# Patient Record
Sex: Female | Born: 1980 | Race: White | Hispanic: Yes | Marital: Single | Smoking: Never smoker
Health system: Southern US, Community
[De-identification: ages and names within clinical notes are randomized; demographics above are authoritative.]

## PROBLEM LIST (undated history)

## (undated) DIAGNOSIS — K819 Cholecystitis, unspecified: Secondary | ICD-10-CM

## (undated) DIAGNOSIS — Z531 Procedure and treatment not carried out because of patient's decision for reasons of belief and group pressure: Secondary | ICD-10-CM

## (undated) DIAGNOSIS — N390 Urinary tract infection, site not specified: Secondary | ICD-10-CM

---

## 1998-09-18 ENCOUNTER — Emergency Department (HOSPITAL_COMMUNITY): Admission: EM | Admit: 1998-09-18 | Discharge: 1998-09-18 | Payer: Self-pay | Admitting: Emergency Medicine

## 2000-01-26 ENCOUNTER — Inpatient Hospital Stay (HOSPITAL_COMMUNITY): Admission: AD | Admit: 2000-01-26 | Discharge: 2000-01-26 | Payer: Self-pay | Admitting: Obstetrics & Gynecology

## 2000-03-16 ENCOUNTER — Ambulatory Visit (HOSPITAL_COMMUNITY): Admission: RE | Admit: 2000-03-16 | Discharge: 2000-03-16 | Payer: Self-pay | Admitting: *Deleted

## 2000-03-30 ENCOUNTER — Ambulatory Visit (HOSPITAL_COMMUNITY): Admission: RE | Admit: 2000-03-30 | Discharge: 2000-03-30 | Payer: Self-pay | Admitting: Obstetrics & Gynecology

## 2000-08-07 ENCOUNTER — Inpatient Hospital Stay (HOSPITAL_COMMUNITY): Admission: AD | Admit: 2000-08-07 | Discharge: 2000-08-08 | Payer: Self-pay | Admitting: Obstetrics

## 2002-08-07 ENCOUNTER — Encounter (HOSPITAL_COMMUNITY): Admission: RE | Admit: 2002-08-07 | Discharge: 2002-08-07 | Payer: Self-pay | Admitting: *Deleted

## 2002-08-09 ENCOUNTER — Inpatient Hospital Stay (HOSPITAL_COMMUNITY): Admission: AD | Admit: 2002-08-09 | Discharge: 2002-08-11 | Payer: Self-pay | Admitting: *Deleted

## 2004-03-13 ENCOUNTER — Inpatient Hospital Stay (HOSPITAL_COMMUNITY): Admission: AD | Admit: 2004-03-13 | Discharge: 2004-03-15 | Payer: Self-pay | Admitting: Obstetrics

## 2006-10-25 ENCOUNTER — Emergency Department (HOSPITAL_COMMUNITY): Admission: EM | Admit: 2006-10-25 | Discharge: 2006-10-25 | Payer: Self-pay | Admitting: Emergency Medicine

## 2015-08-04 DIAGNOSIS — K819 Cholecystitis, unspecified: Secondary | ICD-10-CM

## 2015-08-04 HISTORY — DX: Cholecystitis, unspecified: K81.9

## 2015-08-31 ENCOUNTER — Encounter (HOSPITAL_COMMUNITY): Payer: Self-pay | Admitting: *Deleted

## 2015-08-31 ENCOUNTER — Observation Stay (HOSPITAL_COMMUNITY)
Admission: EM | Admit: 2015-08-31 | Discharge: 2015-09-02 | Disposition: A | Discharge status: Home / Self Care | Payer: Medicaid Other | Attending: General Surgery | Admitting: General Surgery

## 2015-08-31 DIAGNOSIS — N390 Urinary tract infection, site not specified: Secondary | ICD-10-CM | POA: Diagnosis not present

## 2015-08-31 DIAGNOSIS — Z23 Encounter for immunization: Secondary | ICD-10-CM | POA: Diagnosis not present

## 2015-08-31 DIAGNOSIS — K8012 Calculus of gallbladder with acute and chronic cholecystitis without obstruction: Principal | ICD-10-CM | POA: Insufficient documentation

## 2015-08-31 DIAGNOSIS — K8000 Calculus of gallbladder with acute cholecystitis without obstruction: Secondary | ICD-10-CM | POA: Diagnosis present

## 2015-08-31 DIAGNOSIS — K810 Acute cholecystitis: Secondary | ICD-10-CM | POA: Diagnosis present

## 2015-08-31 HISTORY — DX: Urinary tract infection, site not specified: N39.0

## 2015-08-31 HISTORY — DX: Cholecystitis, unspecified: K81.9

## 2015-08-31 HISTORY — DX: Procedure and treatment not carried out because of patient's decision for reasons of belief and group pressure: Z53.1

## 2015-08-31 LAB — URINALYSIS, ROUTINE W REFLEX MICROSCOPIC
BILIRUBIN URINE: NEGATIVE
GLUCOSE, UA: NEGATIVE mg/dL
Ketones, ur: >80 mg/dL — AB
Nitrite: POSITIVE — AB
PH: 7.5 (ref 5.0–8.0)
Protein, ur: NEGATIVE mg/dL
SPECIFIC GRAVITY, URINE: 1.008 (ref 1.005–1.030)

## 2015-08-31 LAB — COMPREHENSIVE METABOLIC PANEL
ALBUMIN: 4.5 g/dL (ref 3.5–5.0)
ALK PHOS: 47 U/L (ref 38–126)
ALT: 16 U/L (ref 14–54)
ANION GAP: 11 (ref 5–15)
AST: 17 U/L (ref 15–41)
BUN: <5 mg/dL — ABNORMAL LOW (ref 6–20)
CALCIUM: 9.5 mg/dL (ref 8.9–10.3)
CO2: 21 mmol/L — AB (ref 22–32)
Chloride: 106 mmol/L (ref 101–111)
Creatinine, Ser: 0.49 mg/dL (ref 0.44–1.00)
GFR calc Af Amer: >60 mL/min (ref >60)
GFR calc non Af Amer: >60 mL/min (ref >60)
GLUCOSE: 117 mg/dL — AB (ref 65–99)
POTASSIUM: 3.8 mmol/L (ref 3.5–5.1)
SODIUM: 138 mmol/L (ref 135–145)
Total Bilirubin: 0.9 mg/dL (ref 0.3–1.2)
Total Protein: 7.6 g/dL (ref 6.5–8.1)

## 2015-08-31 LAB — URINE MICROSCOPIC-ADD ON

## 2015-08-31 LAB — CBC
HEMATOCRIT: 41 % (ref 36.0–46.0)
HEMOGLOBIN: 14.2 g/dL (ref 12.0–15.0)
MCH: 29.8 pg (ref 26.0–34.0)
MCHC: 34.6 g/dL (ref 30.0–36.0)
MCV: 86.1 fL (ref 78.0–100.0)
Platelets: 252 10*3/uL (ref 150–400)
RBC: 4.76 MIL/uL (ref 3.87–5.11)
RDW: 12.5 % (ref 11.5–15.5)
WBC: 9.9 10*3/uL (ref 4.0–10.5)

## 2015-08-31 LAB — I-STAT BETA HCG BLOOD, ED (MC, WL, AP ONLY): I-stat hCG, quantitative: <5 m[IU]/mL (ref <5)

## 2015-08-31 LAB — LIPASE, BLOOD: Lipase: 20 U/L (ref 11–51)

## 2015-08-31 MED ORDER — OXYCODONE-ACETAMINOPHEN 5-325 MG PO TABS
1.0000 | ORAL_TABLET | ORAL | Status: DC | PRN
  Administered 2015-08-31: 1 via ORAL

## 2015-08-31 MED ORDER — OXYCODONE-ACETAMINOPHEN 5-325 MG PO TABS
ORAL_TABLET | ORAL | Status: AC
  Filled 2015-08-31: qty 1

## 2015-08-31 NOTE — ED Notes (Signed)
Pt was sent here with 2 day history of right upper abdominal pain that radiates to back and US showed findings suggestive of acute cholecystitis and biliary distention.

## 2015-09-01 ENCOUNTER — Observation Stay (HOSPITAL_COMMUNITY): Payer: Medicaid Other | Admitting: Certified Registered Nurse Anesthetist

## 2015-09-01 ENCOUNTER — Encounter (HOSPITAL_COMMUNITY): Payer: Self-pay | Admitting: Certified Registered Nurse Anesthetist

## 2015-09-01 ENCOUNTER — Encounter (HOSPITAL_COMMUNITY)
Admission: EM | Disposition: A | Discharge status: Home / Self Care | Payer: Self-pay | Source: Home / Self Care | Attending: Emergency Medicine

## 2015-09-01 DIAGNOSIS — Z23 Encounter for immunization: Secondary | ICD-10-CM | POA: Diagnosis not present

## 2015-09-01 DIAGNOSIS — N390 Urinary tract infection, site not specified: Secondary | ICD-10-CM | POA: Diagnosis not present

## 2015-09-01 DIAGNOSIS — K8012 Calculus of gallbladder with acute and chronic cholecystitis without obstruction: Secondary | ICD-10-CM | POA: Diagnosis not present

## 2015-09-01 DIAGNOSIS — K8000 Calculus of gallbladder with acute cholecystitis without obstruction: Secondary | ICD-10-CM | POA: Diagnosis present

## 2015-09-01 HISTORY — PX: CHOLECYSTECTOMY: SHX55

## 2015-09-01 SURGERY — LAPAROSCOPIC CHOLECYSTECTOMY WITH INTRAOPERATIVE CHOLANGIOGRAM
Anesthesia: General | Site: Abdomen

## 2015-09-01 MED ORDER — DEXTROSE 5 % IV SOLN
2.0000 g | Freq: Once | INTRAVENOUS | Status: AC
  Administered 2015-09-01: 2 g via INTRAVENOUS
  Filled 2015-09-01: qty 2

## 2015-09-01 MED ORDER — DEXAMETHASONE SODIUM PHOSPHATE 10 MG/ML IJ SOLN
INTRAMUSCULAR | Status: AC
  Filled 2015-09-01: qty 1

## 2015-09-01 MED ORDER — ONDANSETRON 4 MG PO TBDP: 4.0000 mg | ORAL_TABLET | Freq: Four times a day (QID) | ORAL | Status: DC | PRN

## 2015-09-01 MED ORDER — LIDOCAINE HCL (CARDIAC) 20 MG/ML IV SOLN
INTRAVENOUS | Status: DC | PRN
  Administered 2015-09-01: 40 mg via INTRAVENOUS

## 2015-09-01 MED ORDER — 0.9 % SODIUM CHLORIDE (POUR BTL) OPTIME
TOPICAL | Status: DC | PRN
  Administered 2015-09-01: 1000 mL

## 2015-09-01 MED ORDER — DIPHENHYDRAMINE HCL 12.5 MG/5ML PO ELIX: 12.5000 mg | ORAL_SOLUTION | Freq: Four times a day (QID) | ORAL | Status: DC | PRN

## 2015-09-01 MED ORDER — ACETAMINOPHEN 650 MG RE SUPP: 650.0000 mg | Freq: Four times a day (QID) | RECTAL | Status: DC | PRN

## 2015-09-01 MED ORDER — ACETAMINOPHEN 160 MG/5ML PO SOLN
325.0000 mg | ORAL | Status: DC | PRN
  Filled 2015-09-01: qty 20.3

## 2015-09-01 MED ORDER — ONDANSETRON HCL 4 MG/2ML IJ SOLN
INTRAMUSCULAR | Status: DC | PRN
  Administered 2015-09-01: 4 mg via INTRAVENOUS

## 2015-09-01 MED ORDER — GLYCOPYRROLATE 0.2 MG/ML IJ SOLN
INTRAMUSCULAR | Status: DC | PRN
  Administered 2015-09-01: .2 mg via INTRAVENOUS

## 2015-09-01 MED ORDER — KCL IN DEXTROSE-NACL 20-5-0.45 MEQ/L-%-% IV SOLN
INTRAVENOUS | Status: DC
  Administered 2015-09-01 – 2015-09-02 (×3): via INTRAVENOUS
  Filled 2015-09-01 (×3): qty 1000

## 2015-09-01 MED ORDER — FENTANYL CITRATE (PF) 100 MCG/2ML IJ SOLN
INTRAMUSCULAR | Status: DC | PRN
  Administered 2015-09-01 (×3): 50 ug via INTRAVENOUS
  Administered 2015-09-01: 100 ug via INTRAVENOUS
  Administered 2015-09-01 (×5): 50 ug via INTRAVENOUS

## 2015-09-01 MED ORDER — FENTANYL CITRATE (PF) 250 MCG/5ML IJ SOLN
INTRAMUSCULAR | Status: AC
  Filled 2015-09-01: qty 5

## 2015-09-01 MED ORDER — SCOPOLAMINE 1 MG/3DAYS TD PT72
MEDICATED_PATCH | TRANSDERMAL | Status: AC
  Administered 2015-09-01: 1 via TRANSDERMAL
  Filled 2015-09-01: qty 1

## 2015-09-01 MED ORDER — IOPAMIDOL (ISOVUE-300) INJECTION 61%
INTRAVENOUS | Status: AC
  Filled 2015-09-01: qty 50

## 2015-09-01 MED ORDER — DIPHENHYDRAMINE HCL 50 MG/ML IJ SOLN: 12.5000 mg | Freq: Four times a day (QID) | INTRAMUSCULAR | Status: DC | PRN

## 2015-09-01 MED ORDER — DEXAMETHASONE SODIUM PHOSPHATE 10 MG/ML IJ SOLN
INTRAMUSCULAR | Status: DC | PRN
  Administered 2015-09-01: 10 mg via INTRAVENOUS

## 2015-09-01 MED ORDER — FENTANYL CITRATE (PF) 100 MCG/2ML IJ SOLN
INTRAMUSCULAR | Status: AC
  Filled 2015-09-01: qty 2

## 2015-09-01 MED ORDER — OXYCODONE HCL 5 MG/5ML PO SOLN: 5.0000 mg | Freq: Once | ORAL | Status: DC | PRN

## 2015-09-01 MED ORDER — ZOLPIDEM TARTRATE 5 MG PO TABS: 5.0000 mg | ORAL_TABLET | Freq: Every evening | ORAL | Status: DC | PRN

## 2015-09-01 MED ORDER — MIDAZOLAM HCL 5 MG/5ML IJ SOLN
INTRAMUSCULAR | Status: DC | PRN
  Administered 2015-09-01: 2 mg via INTRAVENOUS

## 2015-09-01 MED ORDER — OXYCODONE HCL 5 MG PO TABS: 5.0000 mg | ORAL_TABLET | Freq: Once | ORAL | Status: DC | PRN

## 2015-09-01 MED ORDER — SUCCINYLCHOLINE CHLORIDE 20 MG/ML IJ SOLN
INTRAMUSCULAR | Status: DC | PRN
  Administered 2015-09-01: 40 mg via INTRAVENOUS

## 2015-09-01 MED ORDER — ROCURONIUM BROMIDE 50 MG/5ML IV SOLN
INTRAVENOUS | Status: AC
  Filled 2015-09-01: qty 1

## 2015-09-01 MED ORDER — LACTATED RINGERS IV SOLN
INTRAVENOUS | Status: DC | PRN
  Administered 2015-09-01 (×2): via INTRAVENOUS

## 2015-09-01 MED ORDER — BUPIVACAINE-EPINEPHRINE 0.25% -1:200000 IJ SOLN
INTRAMUSCULAR | Status: DC | PRN
  Administered 2015-09-01: 20 mL

## 2015-09-01 MED ORDER — SODIUM CHLORIDE 0.9 % IV BOLUS (SEPSIS)
1000.0000 mL | Freq: Once | INTRAVENOUS | Status: AC
  Administered 2015-09-01: 1000 mL via INTRAVENOUS

## 2015-09-01 MED ORDER — ACETAMINOPHEN 325 MG PO TABS: 325.0000 mg | ORAL_TABLET | ORAL | Status: DC | PRN

## 2015-09-01 MED ORDER — SIMETHICONE 80 MG PO CHEW
40.0000 mg | CHEWABLE_TABLET | Freq: Four times a day (QID) | ORAL | Status: DC | PRN
  Administered 2015-09-01: 40 mg via ORAL
  Filled 2015-09-01 (×2): qty 1

## 2015-09-01 MED ORDER — MORPHINE SULFATE (PF) 2 MG/ML IV SOLN: 1.0000 mg | INTRAVENOUS | Status: DC | PRN

## 2015-09-01 MED ORDER — ONDANSETRON HCL 4 MG/2ML IJ SOLN
4.0000 mg | Freq: Once | INTRAMUSCULAR | Status: AC
  Administered 2015-09-01: 4 mg via INTRAVENOUS
  Filled 2015-09-01: qty 2

## 2015-09-01 MED ORDER — HEMOSTATIC AGENTS (NO CHARGE) OPTIME
TOPICAL | Status: DC | PRN
  Administered 2015-09-01: 1 via TOPICAL

## 2015-09-01 MED ORDER — INFLUENZA VAC SPLIT QUAD 0.5 ML IM SUSY
0.5000 mL | PREFILLED_SYRINGE | INTRAMUSCULAR | Status: AC
  Administered 2015-09-02: 0.5 mL via INTRAMUSCULAR
  Filled 2015-09-01: qty 0.5

## 2015-09-01 MED ORDER — MIDAZOLAM HCL 2 MG/2ML IJ SOLN
INTRAMUSCULAR | Status: AC
  Filled 2015-09-01: qty 2

## 2015-09-01 MED ORDER — DEXTROSE 5 % IV SOLN
2.0000 g | INTRAVENOUS | Status: DC
  Administered 2015-09-01: 2 g via INTRAVENOUS
  Filled 2015-09-01 (×2): qty 2

## 2015-09-01 MED ORDER — ACETAMINOPHEN 325 MG PO TABS
650.0000 mg | ORAL_TABLET | Freq: Four times a day (QID) | ORAL | Status: DC | PRN
  Administered 2015-09-01: 650 mg via ORAL
  Filled 2015-09-01: qty 2

## 2015-09-01 MED ORDER — METHOCARBAMOL 500 MG PO TABS: 500.0000 mg | ORAL_TABLET | Freq: Four times a day (QID) | ORAL | Status: DC | PRN

## 2015-09-01 MED ORDER — SODIUM CHLORIDE 0.9 % IR SOLN
Status: DC | PRN
  Administered 2015-09-01 (×2): 1000 mL

## 2015-09-01 MED ORDER — FENTANYL CITRATE (PF) 100 MCG/2ML IJ SOLN
25.0000 ug | INTRAMUSCULAR | Status: DC | PRN
  Administered 2015-09-01 (×2): 25 ug via INTRAVENOUS

## 2015-09-01 MED ORDER — KETOROLAC TROMETHAMINE 15 MG/ML IJ SOLN: 15.0000 mg | Freq: Four times a day (QID) | INTRAMUSCULAR | Status: DC | PRN

## 2015-09-01 MED ORDER — KETOROLAC TROMETHAMINE 15 MG/ML IJ SOLN
15.0000 mg | Freq: Four times a day (QID) | INTRAMUSCULAR | Status: DC
  Administered 2015-09-01 – 2015-09-02 (×2): 15 mg via INTRAVENOUS
  Filled 2015-09-01 (×2): qty 1

## 2015-09-01 MED ORDER — NEOSTIGMINE METHYLSULFATE 10 MG/10ML IV SOLN
INTRAVENOUS | Status: DC | PRN
  Administered 2015-09-01: 3 mg via INTRAVENOUS

## 2015-09-01 MED ORDER — MORPHINE SULFATE (PF) 4 MG/ML IV SOLN
6.0000 mg | Freq: Once | INTRAVENOUS | Status: AC
  Administered 2015-09-01: 6 mg via INTRAVENOUS
  Filled 2015-09-01: qty 2

## 2015-09-01 MED ORDER — BUPIVACAINE-EPINEPHRINE (PF) 0.25% -1:200000 IJ SOLN
INTRAMUSCULAR | Status: AC
  Filled 2015-09-01: qty 30

## 2015-09-01 MED ORDER — ONDANSETRON HCL 4 MG/2ML IJ SOLN: 4.0000 mg | Freq: Four times a day (QID) | INTRAMUSCULAR | Status: DC | PRN

## 2015-09-01 MED ORDER — OXYCODONE-ACETAMINOPHEN 5-325 MG PO TABS
1.0000 | ORAL_TABLET | ORAL | Status: AC | PRN
  Administered 2015-09-01: 1 via ORAL
  Filled 2015-09-01: qty 1

## 2015-09-01 MED ORDER — PROPOFOL 10 MG/ML IV BOLUS
INTRAVENOUS | Status: DC | PRN
  Administered 2015-09-01: 140 mg via INTRAVENOUS
  Administered 2015-09-01: 60 mg via INTRAVENOUS

## 2015-09-01 MED ORDER — ROCURONIUM BROMIDE 100 MG/10ML IV SOLN
INTRAVENOUS | Status: DC | PRN
  Administered 2015-09-01: 10 mg via INTRAVENOUS
  Administered 2015-09-01: 30 mg via INTRAVENOUS

## 2015-09-01 MED ORDER — DOCUSATE SODIUM 100 MG PO CAPS
100.0000 mg | ORAL_CAPSULE | Freq: Two times a day (BID) | ORAL | Status: DC
  Administered 2015-09-01 – 2015-09-02 (×3): 100 mg via ORAL
  Filled 2015-09-01 (×3): qty 1

## 2015-09-01 MED ORDER — LIDOCAINE HCL (CARDIAC) 20 MG/ML IV SOLN
INTRAVENOUS | Status: AC
  Filled 2015-09-01: qty 5

## 2015-09-01 SURGICAL SUPPLY — 42 items
ADH SKN CLS APL DERMABOND .7 (GAUZE/BANDAGES/DRESSINGS) ×1
APPLIER CLIP 5 13 M/L LIGAMAX5 (MISCELLANEOUS) ×2
APR CLP MED LRG 5 ANG JAW (MISCELLANEOUS) ×1
BAG SPEC RTRVL LRG 6X4 10 (ENDOMECHANICALS) ×2
CANISTER SUCTION 2500CC (MISCELLANEOUS) ×2 IMPLANT
CHLORAPREP W/TINT 26ML (MISCELLANEOUS) ×2 IMPLANT
CLIP APPLIE 5 13 M/L LIGAMAX5 (MISCELLANEOUS) ×1 IMPLANT
COVER SURGICAL LIGHT HANDLE (MISCELLANEOUS) ×2 IMPLANT
DERMABOND ADVANCED (GAUZE/BANDAGES/DRESSINGS) ×1
DERMABOND ADVANCED .7 DNX12 (GAUZE/BANDAGES/DRESSINGS) ×1 IMPLANT
DRAPE C-ARM 42X72 X-RAY (DRAPES) IMPLANT
DRSG TEGADERM 2-3/8X2-3/4 SM (GAUZE/BANDAGES/DRESSINGS) ×8 IMPLANT
ELECT REM PT RETURN 9FT ADLT (ELECTROSURGICAL) ×2
ELECTRODE REM PT RTRN 9FT ADLT (ELECTROSURGICAL) ×1 IMPLANT
GLOVE BIO SURGEON STRL SZ 6.5 (GLOVE) ×1 IMPLANT
GLOVE BIOGEL PI IND STRL 7.5 (GLOVE) IMPLANT
GLOVE BIOGEL PI IND STRL 8 (GLOVE) ×1 IMPLANT
GLOVE BIOGEL PI INDICATOR 7.5 (GLOVE) ×2
GLOVE BIOGEL PI INDICATOR 8 (GLOVE) ×1
GLOVE ECLIPSE 7.5 STRL STRAW (GLOVE) ×2 IMPLANT
GLOVE SURG SS PI 7.5 STRL IVOR (GLOVE) ×2 IMPLANT
GOWN STRL REUS W/ TWL LRG LVL3 (GOWN DISPOSABLE) ×3 IMPLANT
GOWN STRL REUS W/TWL LRG LVL3 (GOWN DISPOSABLE) ×6
HEMOSTAT SNOW SURGICEL 2X4 (HEMOSTASIS) ×1 IMPLANT
KIT BASIN OR (CUSTOM PROCEDURE TRAY) ×2 IMPLANT
KIT ROOM TURNOVER OR (KITS) ×2 IMPLANT
NS IRRIG 1000ML POUR BTL (IV SOLUTION) ×2 IMPLANT
PAD ARMBOARD 7.5X6 YLW CONV (MISCELLANEOUS) ×2 IMPLANT
POUCH SPECIMEN RETRIEVAL 10MM (ENDOMECHANICALS) ×2 IMPLANT
SCISSORS LAP 5X35 DISP (ENDOMECHANICALS) ×2 IMPLANT
SET IRRIG TUBING LAPAROSCOPIC (IRRIGATION / IRRIGATOR) ×2 IMPLANT
SLEEVE ENDOPATH XCEL 5M (ENDOMECHANICALS) ×4 IMPLANT
SPECIMEN JAR SMALL (MISCELLANEOUS) ×2 IMPLANT
STRIP CLOSURE SKIN 1/2X4 (GAUZE/BANDAGES/DRESSINGS) ×2 IMPLANT
SUT MNCRL AB 4-0 PS2 18 (SUTURE) ×2 IMPLANT
TOWEL OR 17X24 6PK STRL BLUE (TOWEL DISPOSABLE) ×2 IMPLANT
TOWEL OR 17X26 10 PK STRL BLUE (TOWEL DISPOSABLE) ×2 IMPLANT
TRAY LAPAROSCOPIC MC (CUSTOM PROCEDURE TRAY) ×2 IMPLANT
TROCAR BLADELESS 11MM (ENDOMECHANICALS) ×1 IMPLANT
TROCAR XCEL BLUNT TIP 100MML (ENDOMECHANICALS) ×2 IMPLANT
TROCAR XCEL NON-BLD 5MMX100MML (ENDOMECHANICALS) ×2 IMPLANT
TUBING INSUFFLATION (TUBING) ×2 IMPLANT

## 2015-09-01 NOTE — ED Provider Notes (Signed)
CSN: [id number]     Arrival date & time 08/31/15  1804 History  By signing my name below, I, Bethel Born, attest that this documentation has been prepared under the direction and in the presence of Tomasita Crumble, MD. Electronically Signed: Bethel Born, ED Scribe. 09/01/2015. 12:57 AM   Chief Complaint  Patient presents with  . Abdominal Pain    The history is provided by the patient. No language interpreter was used.   Stacey Wade is a 35 y.o. female with no significant PMHx who presents to the Emergency Department complaining of, new, sharp, 9/10 in severity, upper abdominal pain with onset 2 days ago. Drinking Alka Seltzer and a baking soda water solution have provided insufficient pain relief at home. Associated symptoms include back pain, nausea, and 1 episode of emesis tonight. Pt present with an [place] report, after being seen at Urgent Care yesterday, that is suggestive of cholecystitis and biliary distention. After the [place] she was referred to the ED for further evaluation and treatment. Pt denies diarrhea, dysuria, and hematuria. No history of abdominal surgery.    History reviewed. No pertinent past medical history. History reviewed. No pertinent past surgical history. No family history on file. Social History  Substance Use Topics  . Smoking status: Never Smoker   . Smokeless tobacco: None  . Alcohol Use: No   OB History    No data available     Review of Systems  10 Systems reviewed and all are negative for acute change except as noted in the HPI.  Allergies  Review of patient's allergies indicates no known allergies.  Home Medications   Prior to Admission medications   Not on File   BP 128/83 mmHg  Pulse 99  Temp(Src) 98.2 F (36.8 C) (Oral)  Resp 18  SpO2 98% Physical Exam  Constitutional: She is oriented to person, place, and time. She appears well-developed and well-nourished. No distress.  HENT:  Head: Normocephalic and atraumatic.  Nose:  Nose normal.  Mouth/Throat: Oropharynx is clear and moist. No oropharyngeal exudate.  Eyes: Conjunctivae and EOM are normal. Pupils are equal, round, and reactive to light. No scleral icterus.  Neck: Normal range of motion. Neck supple. No JVD present. No tracheal deviation present. No thyromegaly present.  Cardiovascular: Normal rate, regular rhythm and normal heart sounds.  Exam reveals no gallop and no friction rub.   No murmur heard. Pulmonary/Chest: Effort normal and breath sounds normal. No respiratory distress. She has no wheezes. She exhibits no tenderness.  Abdominal: Soft. Bowel sounds are normal. She exhibits no distension and no mass. There is tenderness in the right upper quadrant. There is positive Murphy's sign. There is no rebound and no guarding.  Musculoskeletal: Normal range of motion. She exhibits no edema or tenderness.  Lymphadenopathy:    She has no cervical adenopathy.  Neurological: She is alert and oriented to person, place, and time. No cranial nerve deficit. She exhibits normal muscle tone.  Skin: Skin is warm and dry. No rash noted. No erythema. No pallor.  Nursing note and vitals reviewed.   ED Course  Procedures (including critical care time) DIAGNOSTIC STUDIES: Oxygen Saturation is 98% on RA,  normal by my interpretation.    COORDINATION OF CARE: 12:39 AM Discussed treatment plan which includes lab work and surgery consult with pt at bedside and pt agreed to plan.  12:55 AM-Consult complete with Dr. Donell Beers (General Surgery). Patient case explained and discussed. Call ended at 12:57 AM   Labs Review  Labs Reviewed  COMPREHENSIVE METABOLIC PANEL - Abnormal; Notable for the following:    CO2 21 (*)    Glucose, Bld 117 (*)    BUN <5 (*)    All other components within normal limits  URINALYSIS, ROUTINE W REFLEX MICROSCOPIC (NOT AT [facility]) - Abnormal; Notable for the following:    APPearance CLOUDY (*)    Hgb urine dipstick SMALL (*)    Ketones, ur >80 (*)     Nitrite POSITIVE (*)    Leukocytes, UA TRACE (*)    All other components within normal limits  URINE MICROSCOPIC-ADD ON - Abnormal; Notable for the following:    Squamous Epithelial / LPF 0-5 (*)    Bacteria, UA MANY (*)    All other components within normal limits  LIPASE, BLOOD  CBC  I-STAT BETA HCG BLOOD, ED ([facility], [facility], [facility] ONLY)    Imaging Review No results found. I have personally reviewed and evaluated these lab results as part of my medical decision-making.   EKG Interpretation None      MDM   Final diagnoses:  None    Patient presents emergency department for right upper quadrant abdominal pain. History is concerning for acute cholecystitis. She has an ultrasound from [facility] which also shows gallbladder distention, thickened gallbladder wall, pericholecystic fluid, and positive sonographic Murphy sign. There is concern for acute cholecystitis. My physical exam also shows a positive Murphy's sign. Patient was given ceftriaxone for treatment. She was given morphine for pain control. She will be admitted for operative intervention.   I personally performed the services described in this documentation, which was scribed in my presence. The recorded information has been reviewed and is accurate.      Tomasita CrumbleAdeleke Ethyl Vila, MD 09/01/15 [phone]

## 2015-09-01 NOTE — Interval H&P Note (Signed)
History and Physical Interval Note:  Unfortunately the patient is still in the ED as there are no beds available in the hospital.  We will perform a lap chole with IOC in the OR later today, probably early afternoon.  Patient and her husband had no additional questios.  Marta LamasJames O. Gae BonWyatt, III, MD, FACS [phone]--pager [phone]--office [facility]  09/01/2015 7:43 AM  Stacey Wade  has presented today for surgery, with the diagnosis of cholelithiasis  The various methods of treatment have been discussed with the patient and family. After consideration of risks, benefits and other options for treatment, the patient has consented to  Procedure(s): LAPAROSCOPIC CHOLECYSTECTOMY WITH INTRAOPERATIVE CHOLANGIOGRAM (N/A) as a surgical intervention .  The patient's history has been reviewed, patient examined, no change in status, stable for surgery.  I have reviewed the patient's chart and labs.  Questions were answered to the patient's satisfaction.     Stacey Wade

## 2015-09-01 NOTE — Anesthesia Postprocedure Evaluation (Signed)
Anesthesia Post Note  Patient: Stacey JakobSoila M Wade  Procedure(s) Performed: Procedure(s) (LRB): LAPAROSCOPIC CHOLECYSTECTOMY (N/A)  Patient location during evaluation: PACU Anesthesia Type: General Level of consciousness: awake and alert Pain management: pain level controlled Vital Signs Assessment: post-procedure vital signs reviewed and stable Respiratory status: spontaneous breathing, nonlabored ventilation, respiratory function stable and patient connected to nasal cannula oxygen Cardiovascular status: blood pressure returned to baseline and stable Postop Assessment: no signs of nausea or vomiting Anesthetic complications: no    Last Vitals:  Filed Vitals:   09/01/15 1255 09/01/15 1310  BP: 107/65 107/68  Pulse: 88 68  Temp: 36.4 C   Resp: 13 12    Last Pain:  Filed Vitals:   09/01/15 1320  PainSc: 1                  Taeya Theall S

## 2015-09-01 NOTE — Discharge Instructions (Signed)
Laparoscopic Cholecystectomy, Care After °Refer to this sheet in the next few weeks. These instructions provide you with information about caring for yourself after your procedure. Your health care provider may also give you more specific instructions. Your treatment has been planned according to current medical practices, but problems sometimes occur. Call your health care provider if you have any problems or questions after your procedure. °WHAT TO EXPECT AFTER THE PROCEDURE °After your procedure, it is common to have: °· Pain at your incision sites. You will be given pain medicines to control your pain. °· Mild nausea or vomiting. This should improve after the first 24 hours. °· Bloating and possible shoulder pain from the gas that was used during the procedure. This will improve after the first 24 hours. °HOME CARE INSTRUCTIONS °Incision Care °· Follow instructions from your health care provider about how to take care of your incisions. Make sure you: °¨ Wash your hands with soap and water before you change your bandage (dressing). If soap and water are not available, use hand sanitizer. °¨ Change your dressing as told by your health care provider. °¨ Leave stitches (sutures), skin glue, or adhesive strips in place. These skin closures may need to be in place for 2 weeks or longer. If adhesive strip edges start to loosen and curl up, you may trim the loose edges. Do not remove adhesive strips completely unless your health care provider tells you to do that. °· Do not take baths, swim, or use a hot tub until your health care provider approves. Ask your health care provider if you can take showers. You may only be allowed to take sponge baths for bathing. °General Instructions °· Take over-the-counter and prescription medicines only as told by your health care provider. °· Do not drive or operate heavy machinery while taking prescription pain medicine. °· Return to your normal diet as told by your health care  provider. °· Do not lift anything that is heavier than 10 lb (4.5 kg). °· Do not play contact sports for one week or until your health care provider approves. °SEEK MEDICAL CARE IF:  °· You have redness, swelling, or pain at the site of your incision. °· You have fluid, blood, or pus coming from your incision. °· You notice a bad smell coming from your incision area. °· Your surgical incisions break open. °· You have a fever. °SEEK IMMEDIATE MEDICAL CARE IF: °· You develop a rash. °· You have difficulty breathing. °· You have chest pain. °· You have increasing pain in your shoulders (shoulder strap areas). °· You faint or have dizzy episodes while you are standing. °· You have severe pain in your abdomen. °· You have nausea or vomiting that lasts for more than one day. °  °This information is not intended to replace advice given to you by your health care provider. Make sure you discuss any questions you have with your health care provider. °  °Document Released: 05/22/2005 Document Revised: 02/10/2015 Document Reviewed: 01/01/2013 °Elsevier Interactive Patient Education ©2016 [employer] °CCS ______[facility] °LAPAROSCOPIC SURGERY: POST OP INSTRUCTIONS °Always review your discharge instruction sheet given to you by the facility where your surgery was performed. °IF YOU HAVE DISABILITY OR FAMILY LEAVE FORMS, YOU MUST BRING THEM TO THE OFFICE FOR PROCESSING.   °DO NOT GIVE THEM TO YOUR DOCTOR. ° °1. A prescription for pain medication may be given to you upon discharge.  Take your pain medication as prescribed, if needed.  If narcotic   pain medicine is not needed, then you may take acetaminophen (Tylenol) or ibuprofen (Advil) as needed. °2. Take your usually prescribed medications unless otherwise directed. °3. If you need a refill on your pain medication, please contact your pharmacy.  They will contact our office to request authorization. Prescriptions will not be filled after 5pm or on  week-ends. °4. You should follow a light diet the first few days after arrival home, such as soup and crackers, etc.  Be sure to include lots of fluids daily. °5. Most patients will experience some swelling and bruising in the area of the incisions.  Ice packs will help.  Swelling and bruising can take several days to resolve.  °6. It is common to experience some constipation if taking pain medication after surgery.  Increasing fluid intake and taking a stool softener (such as Colace) will usually help or prevent this problem from occurring.  A mild laxative (Milk of Magnesia or Miralax) should be taken according to package instructions if there are no bowel movements after 48 hours. °7. Unless discharge instructions indicate otherwise, you may remove your bandages 24-48 hours after surgery, and you may shower at that time.  You may have steri-strips (small skin tapes) in place directly over the incision.  These strips should be left on the skin for 7-10 days.  If your surgeon used skin glue on the incision, you may shower in 24 hours.  The glue will flake off over the next 2-3 weeks.  Any sutures or staples will be removed at the office during your follow-up visit. °8. ACTIVITIES:  You may resume regular (light) daily activities beginning the next day--such as daily self-care, walking, climbing stairs--gradually increasing activities as tolerated.  You may have sexual intercourse when it is comfortable.  Refrain from any heavy lifting or straining until approved by your doctor. °a. You may drive when you are no longer taking prescription pain medication, you can comfortably wear a seatbelt, and you can safely maneuver your car and apply brakes. °b. RETURN TO WORK:  __________________________________________________________ °9. You should see your doctor in the office for a follow-up appointment approximately 2-3 weeks after your surgery.  Make sure that you call for this appointment within a day or two after you  arrive home to insure a convenient appointment time. °10. OTHER INSTRUCTIONS: __________________________________________________________________________________________________________________________ __________________________________________________________________________________________________________________________ °WHEN TO CALL YOUR DOCTOR: °1. Fever over 101.0 °2. Inability to urinate °3. Continued bleeding from incision. °4. Increased pain, redness, or drainage from the incision. °5. Increasing abdominal pain ° °The clinic staff is available to answer your questions during regular business hours.  Please don’t hesitate to call and ask to speak to one of the nurses for clinical concerns.  If you have a medical emergency, go to the nearest emergency room or call 911.  A surgeon from [facility] is always on call at the hospital. °[address], [address], [place], [place]  [address] ? [address] [address], [place], [place]   [address] °[phone] ? [phone] ? FAX [phone] °Web site: [email or web address] ° °

## 2015-09-01 NOTE — Op Note (Signed)
OPERATIVE REPORT  DATE OF OPERATION:  09/01/2015  PATIENT:  Stacey Wade  35 y.o. female  PRE-OPERATIVE DIAGNOSIS:  Cholelithiasis and acute cholecystitis  POST-OPERATIVE DIAGNOSIS:  Same  PROCEDURE:  Procedure(s): LAPAROSCOPIC CHOLECYSTECTOMY  SURGEON:  Surgeon(s): Jimmye NormanJames Oneal Biglow, MD  ASSISTANT: None  ANESTHESIA:   general  EBL: 50 ml  BLOOD ADMINISTERED: none  DRAINS: none   SPECIMEN:  Source of Specimen:  Gallbladder and contents  COUNTS CORRECT:  YES  PROCEDURE DETAILS: The patient was taken to the operating room and placed on the table in the supine position.  After an adequate endotracheal anesthetic was administered, the patient was prepped with ChloroPrep, and then draped in the usual manner exposing the entire abdomen laterally, inferiorly and up  to the costal margins.  After a proper timeout was performed including identifying the patient and the procedure to be performed, a supraumbilical 1.5cm midline incision was made using a #15 blade.  This was taken down to the fascia which was then incised with a #15 blade.  The edges of the fascia were tented up with Kocher clamps as the preperitoneal space was penetrated with a Kelly clamp into the peritoneum.  Once this was done, a pursestring suture of 0 Vicryl was passed around the fascial opening.  This was subsequently used to secure the [facility] which was passed into the peritoneal cavity.  Once the [facility] cannula was in place, carbon dioxide gas was insufflated into the peritoneal cavity up to a maximal intra-abdominal pressure of 15mm Hg.The laparoscope, with attached camera and light source, was passed into the peritoneal cavity to visualize the direct insertion of two right upper quadrant 5mm cannulas, and a sup-xiphoid 5mm cannula. This was increased to a 11mm cannula during the case to accommodate a stone scooper which was used to retrieved stones that had fallen out of the torn gallbladder.   Once all  cannulas were in place, the dissection was begun.  Two ratcheted graspers were attached to the dome and infundibulum of the gallbladder and retracted towards the anterior abdominal wall and the right upper quadrant.  Using cautery attached to a dissecting forceps, the peritoneum overlaying the triangle of Chalot and the hepatoduodenal triangle was dissected away exposing the cystic duct and the cystic artery.  A critical window was developed between the CBD and the cystic duct The cystic artery was clipped proximally and distally then transected.  A clip was placed on the gallbladder side of the cystic duct, then the distal cystic duct was clipped multiple times then transected between the clips.  The gallbladder was then dissected out of the hepatic bed which was intensely inflamed and fused with the back wall of the gallbladder.  The gall bladder fossa was raw and bled somewhat (i.e. 20 cc or so) The bleeding was controlled with electrocautery.  Once the gallbladder was out, it was retrieved from the abdomen (using an EndoCatch bag) without event.  Multiple fragmented stone were also retrieved using a scoop device.  Once the gallbladder was removed, the bed was inspected for hemostasis.  Once excellent hemostasis was obtained all gas and fluids were aspirated from above the liver, then the cannulas were removed.  The supraumbilical incision was closed using the pursestring suture which was in place.  0.25% bupivicaine with epinephrine was injected at all sites.  All 10mm or greater cannula sites were close using a running subcuticular stitch of 4-0 Monocryl.  5.1050mm cannula sites were closed with Dermabond only.Steri-Strips and Tagaderm were  used to complete the dressings at all sites.  At this point all needle, sponge, and instrument counts were correct.The patient was awakened from anesthesia and taken to the PACU in stable condition.   PATIENT DISPOSITION:  PACU - hemodynamically stable.   Kashvi Prevette 3/29/201712:49 PM

## 2015-09-01 NOTE — Transfer of Care (Signed)
Immediate Anesthesia Transfer of Care Note  Patient: Stacey Wade  Procedure(s) Performed: Procedure(s): LAPAROSCOPIC CHOLECYSTECTOMY (N/A)  Patient Location: PACU  Anesthesia Type:General  Level of Consciousness: awake, alert , oriented and patient cooperative  Airway & Oxygen Therapy: Patient Spontanous Breathing and Patient connected to nasal cannula oxygen  Post-op Assessment: Report given to RN and Post -op Vital signs reviewed and stable  Post vital signs: Reviewed and stable  Last Vitals:  Filed Vitals:   09/01/15 0845 09/01/15 0953  BP: 99/69 112/69  Pulse: 60 70  Temp:    Resp:  16    Complications: No apparent anesthesia complications

## 2015-09-01 NOTE — H&P (Signed)
Stacey Wade is an 35 y.o. female.   Chief Complaint: abdominal pain HPI:  Pt is a 35 yo F who presents with 2 days epigastric abdominal pain, nausea, and 1x emesis.  She went to the urgent care yesterday and was diagnosed with gallstones and wall thickening.  She was referred to ED.  She tried antacids with out relief.  She describes the pain as radiating to her back.  She has had no recent pregnancy and does not know of any family history of gallbladder dx.  History reviewed. No pertinent past medical history.  History reviewed. No pertinent past surgical history.  No family history on file. Social History:  reports that she has never smoked. She does not have any smokeless tobacco history on file. She reports that she does not drink alcohol or use illicit drugs.  Allergies: No Known Allergies  Medications: None  Results for orders placed or performed during the hospital encounter of 08/31/15 (from the past 48 hour(s))  Lipase, blood     Status: None   Collection Time: 08/31/15  6:26 PM  Result Value Ref Range   Lipase 20 11 - 51 U/L  Comprehensive metabolic panel     Status: Abnormal   Collection Time: 08/31/15  6:26 PM  Result Value Ref Range   Sodium 138 135 - 145 mmol/L   Potassium 3.8 3.5 - 5.1 mmol/L   Chloride 106 101 - 111 mmol/L   CO2 21 (L) 22 - 32 mmol/L   Glucose, Bld 117 (H) 65 - 99 mg/dL   BUN <5 (L) 6 - 20 mg/dL   Creatinine, Ser 0.49 0.44 - 1.00 mg/dL   Calcium 9.5 8.9 - 10.3 mg/dL   Total Protein 7.6 6.5 - 8.1 g/dL   Albumin 4.5 3.5 - 5.0 g/dL   AST 17 15 - 41 U/L   ALT 16 14 - 54 U/L   Alkaline Phosphatase 47 38 - 126 U/L   Total Bilirubin 0.9 0.3 - 1.2 mg/dL   GFR calc non Af Amer >60 >60 mL/min   GFR calc Af Amer >60 >60 mL/min    Comment: (NOTE) The eGFR has been calculated using the CKD EPI equation. This calculation has not been validated in all clinical situations. eGFR's persistently <60 mL/min signify possible Chronic Kidney Disease.     Anion gap 11 5 - 15  CBC     Status: None   Collection Time: 08/31/15  6:26 PM  Result Value Ref Range   WBC 9.9 4.0 - 10.5 K/uL   RBC 4.76 3.87 - 5.11 MIL/uL   Hemoglobin 14.2 12.0 - 15.0 g/dL   HCT 41.0 36.0 - 46.0 %   MCV 86.1 78.0 - 100.0 fL   MCH 29.8 26.0 - 34.0 pg   MCHC 34.6 30.0 - 36.0 g/dL   RDW 12.5 11.5 - 15.5 %   Platelets 252 150 - 400 K/uL  Urinalysis, Routine w reflex microscopic (not at [facility])     Status: Abnormal   Collection Time: 08/31/15  6:48 PM  Result Value Ref Range   Color, Urine YELLOW YELLOW   APPearance CLOUDY (A) CLEAR   Specific Gravity, Urine 1.008 1.005 - 1.030   pH 7.5 5.0 - 8.0   Glucose, UA NEGATIVE NEGATIVE mg/dL   Hgb urine dipstick SMALL (A) NEGATIVE   Bilirubin Urine NEGATIVE NEGATIVE   Ketones, ur >80 (A) NEGATIVE mg/dL   Protein, ur NEGATIVE NEGATIVE mg/dL   Nitrite POSITIVE (A) NEGATIVE   Leukocytes,  UA TRACE (A) NEGATIVE  I-Stat beta hCG blood, ED ([facility], [facility], [facility] only)     Status: None   Collection Time: 08/31/15  6:48 PM  Result Value Ref Range   I-stat hCG, quantitative <5.0 <5 mIU/mL   Comment 3            Comment:   GEST. AGE      CONC.  (mIU/mL)   <=1 WEEK        5 - 50     2 WEEKS       50 - 500     3 WEEKS       100 - 10,000     4 WEEKS     1,000 - 30,000        FEMALE AND NON-PREGNANT FEMALE:     LESS THAN 5 mIU/mL   Urine microscopic-add on     Status: Abnormal   Collection Time: 08/31/15  6:48 PM  Result Value Ref Range   Squamous Epithelial / LPF 0-5 (A) NONE SEEN   WBC, UA 6-30 0 - 5 WBC/hpf   RBC / HPF 0-5 0 - 5 RBC/hpf   Bacteria, UA MANY (A) NONE SEEN   No results found.  Review of Systems  Constitutional: Negative.   HENT: Negative.   Eyes: Negative.   Respiratory: Negative.   Cardiovascular: Negative.   Gastrointestinal: Positive for nausea, vomiting and abdominal pain.  Musculoskeletal: Positive for back pain.  Skin: Negative.   Neurological: Negative.   Endo/Heme/Allergies: Negative.    Psychiatric/Behavioral: Negative.   All other systems reviewed and are negative.   Blood pressure 107/75, pulse 86, temperature 98.2 F (36.8 C), temperature source Oral, resp. rate 18, SpO2 99 %. Physical Exam  Constitutional: She is oriented to person, place, and time. She appears well-developed and well-nourished. No distress.  HENT:  Head: Normocephalic and atraumatic.  Nose: Nose normal.  Mouth/Throat: Oropharynx is clear and moist.  Eyes: Conjunctivae are normal. Pupils are equal, round, and reactive to light. Right eye exhibits no discharge. Left eye exhibits no discharge. No scleral icterus.  Neck: Normal range of motion. No thyromegaly present.  Cardiovascular: Normal rate, regular rhythm and intact distal pulses.   Respiratory: Effort normal. No respiratory distress.  GI: Soft. She exhibits no distension and no mass. There is tenderness (epigastric mild tenderness). There is no rebound and no guarding.  Musculoskeletal: Normal range of motion.  Neurological: She is alert and oriented to person, place, and time.  Skin: Skin is warm and dry. No rash noted. She is not diaphoretic. No erythema. No pallor.  Psychiatric: She has a normal mood and affect. Her behavior is normal. Judgment and thought content normal.     Assessment/Plan Acute calculous cholecystitis Community acquired UTI  NPO IVF IV antibiotics For lap chole with cholangiogram with Dr. Hulen Skains. Reviewed risks and benefits with patient and spouse.  **of note, pt is Jehovah's witness and desires NO blood products.Stark Klein, MD 09/01/2015, 1:55 AM

## 2015-09-01 NOTE — ED Notes (Signed)
Pt requests to NOT receive any blood products.

## 2015-09-01 NOTE — Anesthesia Preprocedure Evaluation (Signed)
Anesthesia Evaluation  Patient identified by MRN, date of birth, ID band Patient awake    Reviewed: Allergy & Precautions, NPO status , Patient's Chart, lab work & pertinent test results  History of Anesthesia Complications (+) history of anesthetic complications  Airway Mallampati: I  TM Distance: >3 FB Neck ROM: Full    Dental  (+) Teeth Intact   Pulmonary neg pulmonary ROS,    breath sounds clear to auscultation       Cardiovascular  Rhythm:Regular     Neuro/Psych negative neurological ROS  negative psych ROS   GI/Hepatic negative GI ROS, Neg liver ROS,   Endo/Other  negative endocrine ROS  Renal/GU negative Renal ROS     Musculoskeletal   Abdominal   Peds  Hematology  (+) JEHOVAH'S WITNESS  Anesthesia Other Findings   Reproductive/Obstetrics                             Anesthesia Physical Anesthesia Plan  ASA: I  Anesthesia Plan: General   Post-op Pain Management:    Induction: Intravenous, Rapid sequence and Cricoid pressure planned  Airway Management Planned: Oral ETT  Additional Equipment: None  Intra-op Plan:   Post-operative Plan: Extubation in OR  Informed Consent: I have reviewed the patients History and Physical, chart, labs and discussed the procedure including the risks, benefits and alternatives for the proposed anesthesia with the patient or authorized representative who has indicated his/her understanding and acceptance.   Dental advisory given  Plan Discussed with: CRNA and Surgeon  Anesthesia Plan Comments: (Dicussed blood products and patient's wishes preop. Refuses blood and blood related products at all cost including death. Will accept cell saver)        Anesthesia Quick Evaluation

## 2015-09-01 NOTE — Anesthesia Procedure Notes (Signed)
Procedure Name: Intubation Date/Time: 09/01/2015 11:12 AM Performed by: Stacey Wade, Stacey Wade [name] Pre-anesthesia Checklist: Patient identified, Emergency Drugs available, Suction available and Patient being monitored Patient Re-evaluated:Patient Re-evaluated prior to inductionOxygen Delivery Method: Circle System Utilized Preoxygenation: Pre-oxygenation with 100% oxygen Intubation Type: IV induction and Rapid sequence Laryngoscope Size: Miller and 2 Grade View: Grade I Tube type: Oral Tube size: 7.0 mm Number of attempts: 1 Airway Equipment and Method: Stylet Placement Confirmation: ETT inserted through vocal cords under direct vision,  positive ETCO2 and breath sounds checked- equal and bilateral Secured at: 21 cm Tube secured with: Tape Dental Injury: Teeth and Oropharynx as per pre-operative assessment

## 2015-09-02 ENCOUNTER — Encounter (HOSPITAL_COMMUNITY): Payer: Self-pay | Admitting: General Surgery

## 2015-09-02 DIAGNOSIS — Z23 Encounter for immunization: Secondary | ICD-10-CM | POA: Diagnosis not present

## 2015-09-02 DIAGNOSIS — K810 Acute cholecystitis: Secondary | ICD-10-CM | POA: Diagnosis present

## 2015-09-02 DIAGNOSIS — K8012 Calculus of gallbladder with acute and chronic cholecystitis without obstruction: Secondary | ICD-10-CM | POA: Diagnosis not present

## 2015-09-02 DIAGNOSIS — N390 Urinary tract infection, site not specified: Secondary | ICD-10-CM | POA: Diagnosis not present

## 2015-09-02 HISTORY — DX: Urinary tract infection, site not specified: N39.0

## 2015-09-02 MED ORDER — ACETAMINOPHEN 325 MG PO TABS: 650.0000 mg | ORAL_TABLET | Freq: Four times a day (QID) | ORAL | Status: AC | PRN

## 2015-09-02 MED ORDER — NAPROXEN SODIUM 220 MG PO TABS: 220.0000 mg | ORAL_TABLET | Freq: Two times a day (BID) | ORAL | Status: AC

## 2015-09-02 MED ORDER — OXYCODONE-ACETAMINOPHEN 5-325 MG PO TABS: 1.0000 | ORAL_TABLET | ORAL | Status: DC | PRN

## 2015-09-02 NOTE — Progress Notes (Signed)
1 Day Post-Op  Subjective: She is doing well ate breakfast and feels fine.  Ready to go home.  She has a Prescription from her PCP for the uti, but never got to start it.   Objective: Vital signs in last 24 hours: Temp:  [97.5 F (36.4 C)-98.7 F (37.1 C)] 98.7 F (37.1 C) (03/30 0843) Pulse Rate:  [59-88] 78 (03/30 0843) Resp:  [12-24] 18 (03/30 0843) BP: (97-120)/(51-71) 104/58 mmHg (03/30 0843) SpO2:  [99 %-100 %] 100 % (03/30 0843) Last BM Date: 08/31/15 680 PO recorded 900 urine Afebrile, VSS Diet:  Regular   Intake/Output from previous day: 03/29 0701 - 03/30 0700 In: 3951.7 [P.O.:680; I.V.:3271.7] Out: 920 [Urine:900; Blood:20] Intake/Output this shift: Total I/O In: 120 [P.O.:120] Out: -   General appearance: alert, cooperative and no distress GI: soft, sore, sites all look fine.  Lab Results:   Recent Labs  08/31/15 1826  WBC 9.9  HGB 14.2  HCT 41.0  PLT 252    BMET  Recent Labs  08/31/15 1826  NA 138  K 3.8  CL 106  CO2 21*  GLUCOSE 117*  BUN <5*  CREATININE 0.49  CALCIUM 9.5   PT/INR No results for input(s): LABPROT, INR in the last 72 hours.   Recent Labs Lab 08/31/15 1826  AST 17  ALT 16  ALKPHOS 47  BILITOT 0.9  PROT 7.6  ALBUMIN 4.5     Lipase     Component Value Date/Time   LIPASE 20 08/31/2015 1826     Studies/Results: No results found.  Medications: . cefTRIAXone (ROCEPHIN)  IV  2 g Intravenous Q24H  . docusate sodium  100 mg Oral BID  . Influenza vac split quadrivalent PF  0.5 mL Intramuscular Tomorrow-1000  . ketorolac  15 mg Intravenous 4 times per day    Assessment/Plan Acute cholecystitis S/p laparoscopic cholecystectomy 09/01/15, Dr. Woodward KuWyatt Community acquired UTI   Antibiotics: Ceftriaxone day 2 completed DVT:  SCD   Plan:  Home today, she will take her PCP'S antibiotics for her UTI and follow up with them for the UTI      Hebe Merriwether 09/02/2015 [phone]

## 2015-09-02 NOTE — Progress Notes (Signed)
Patient discharged to home with instructions, verbalized understanding. 

## 2015-09-02 NOTE — Discharge Summary (Signed)
Physician Discharge Summary  Patient ID: Stacey Wade MRN: [id number] DOB/AGE: 37/07/1980 37 y.o.  Admit date: 08/31/2015 Discharge date: 09/02/2015  Admission Diagnoses:  Acute calculous cholecystitis Community acquired UTI  Discharge Diagnoses:  SAME  Principal Problem:   Acute calculous cholecystitis Active Problems:   UTI (urinary tract infection)   PROCEDURES: LAPAROSCOPIC CHOLECYSTECTOMY, 09/01/15, DR. Vergia AlbertsJames Wyatt  Hospital Course:   Pt is a 35 yo F who presents with 2 days epigastric abdominal pain, nausea, and 1x emesis. She went to the urgent care yesterday and was diagnosed with gallstones and wall thickening. She was referred to ED. She tried antacids with out relief. She describes the pain as radiating to her back. She has had no recent pregnancy and does not know of any family history of gallbladder dx. She was admitted on 09/01/15 early AM by Dr. Donell BeersByerly, and taken to the OR later that day by Dr. Lindie SpruceWyatt.  She tolerated the procedure well.  Her diet was advanced the following AM and she was ready for discharge the later that AM.  She was treated for a UTI by PCP day prior but had not started the antibiotic.  She got Rocephin here, and we instructed her to take the prescribed antibiotics and follow up with her PCP for her UTI.  She will follow up in our clinic as noted below.  Condition on D/C:  Improved        Disposition: 01-Home or Self Care     Medication List    TAKE these medications        acetaminophen 325 MG tablet  Commonly known as:  TYLENOL  Take 2 tablets (650 mg total) by mouth every 6 (six) hours as needed for mild pain (or temp > 100).     naproxen sodium 220 MG tablet  Commonly known as:  ALEVE  Take 1 tablet (220 mg total) by mouth 2 (two) times daily with a meal.     oxyCODONE-acetaminophen 5-325 MG tablet  Commonly known as:  ROXICET  Take 1-2 tablets by mouth every 4 (four) hours as needed for severe pain.       Follow-up  Information    Follow up with [facility] On 09/21/2015.   Specialty:  General Surgery   Why:  Your appointment is at 2 PM, be at the office 30 minutes early for check in.   Contact information:   [address] [place] [address] [phone]       Follow up with [facility] On 09/21/2015.   Specialty:  General Surgery   Why:  Your appointment is at 2 PM, be at the office 30 minutes early for check in.     Contact information:   [address] [place] [address] [phone]       Follow up with Primary care. Schedule an appointment as soon as possible for a visit in 1 week.   Why:  Call for follow up for UTI after you have completed the course of antibiotics they prescribed prior to admission.      SignedSherrie George: Christin Moline 09/02/2015, 2:04 PM

## 2015-11-04 ENCOUNTER — Other Ambulatory Visit: Payer: Self-pay | Admitting: General Surgery

## 2015-11-04 DIAGNOSIS — R109 Unspecified abdominal pain: Secondary | ICD-10-CM

## 2015-11-09 ENCOUNTER — Ambulatory Visit
Admission: RE | Admit: 2015-11-09 | Discharge: 2015-11-09 | Disposition: A | Discharge status: Home / Self Care | Payer: Medicaid Other | Source: Ambulatory Visit | Attending: General Surgery | Admitting: General Surgery

## 2015-11-09 DIAGNOSIS — R109 Unspecified abdominal pain: Secondary | ICD-10-CM

## 2015-11-09 MED ORDER — IOPAMIDOL (ISOVUE-300) INJECTION 61%
100.0000 mL | Freq: Once | INTRAVENOUS | Status: AC | PRN
  Administered 2015-11-09: 100 mL via INTRAVENOUS

## 2017-07-11 ENCOUNTER — Other Ambulatory Visit: Payer: Self-pay | Admitting: Obstetrics & Gynecology

## 2017-07-11 DIAGNOSIS — Z1231 Encounter for screening mammogram for malignant neoplasm of breast: Secondary | ICD-10-CM

## 2017-07-31 ENCOUNTER — Ambulatory Visit
Admission: RE | Admit: 2017-07-31 | Discharge: 2017-07-31 | Disposition: A | Discharge status: Home / Self Care | Payer: Self-pay | Source: Ambulatory Visit | Attending: Obstetrics & Gynecology | Admitting: Obstetrics & Gynecology

## 2017-07-31 DIAGNOSIS — Z1231 Encounter for screening mammogram for malignant neoplasm of breast: Secondary | ICD-10-CM

## 2019-09-22 ENCOUNTER — Other Ambulatory Visit: Payer: Self-pay

## 2019-09-22 DIAGNOSIS — N644 Mastodynia: Secondary | ICD-10-CM

## 2019-09-22 DIAGNOSIS — N6320 Unspecified lump in the left breast, unspecified quadrant: Secondary | ICD-10-CM

## 2019-10-09 ENCOUNTER — Other Ambulatory Visit: Payer: No Typology Code available for payment source

## 2019-10-09 ENCOUNTER — Encounter: Payer: Self-pay | Admitting: Medical

## 2019-10-09 ENCOUNTER — Other Ambulatory Visit: Payer: Self-pay

## 2019-10-09 ENCOUNTER — Ambulatory Visit
Admission: RE | Admit: 2019-10-09 | Discharge: 2019-10-09 | Disposition: A | Discharge status: Home / Self Care | Payer: No Typology Code available for payment source | Source: Ambulatory Visit | Attending: Obstetrics and Gynecology | Admitting: Obstetrics and Gynecology

## 2019-10-09 ENCOUNTER — Ambulatory Visit: Payer: Self-pay | Admitting: Medical

## 2019-10-09 ENCOUNTER — Other Ambulatory Visit: Payer: Self-pay | Admitting: Obstetrics and Gynecology

## 2019-10-09 VITALS — BP 112/72 | Temp 97.7°F | Wt 99.5 lb

## 2019-10-09 DIAGNOSIS — N644 Mastodynia: Secondary | ICD-10-CM

## 2019-10-09 DIAGNOSIS — N6320 Unspecified lump in the left breast, unspecified quadrant: Secondary | ICD-10-CM

## 2019-10-09 DIAGNOSIS — N6310 Unspecified lump in the right breast, unspecified quadrant: Secondary | ICD-10-CM

## 2019-10-09 DIAGNOSIS — R921 Mammographic calcification found on diagnostic imaging of breast: Secondary | ICD-10-CM

## 2019-10-09 NOTE — Patient Instructions (Signed)
Mammogram °A mammogram is an X-ray of the breasts that is done to check for changes that are not normal. This test can screen for and find any changes that may suggest breast cancer. Mammograms are regularly done on women. A man may have a mammogram if he has a lump or swelling in his breast. This test can also help to find other changes and variations in the breast. °Tell a doctor: °· About any allergies you have. °· If you have breast implants. °· If you have had previous breast disease, biopsy, or surgery. °· If you are breastfeeding. °· If you are younger than age 25. °· If you have a family history of breast cancer. °· Whether you are pregnant or may be pregnant. °What are the risks? °Generally, this is a safe procedure. However, problems may occur, including: °· Exposure to radiation. Radiation levels are very low with this test. °· The results being misinterpreted. °· The need for further tests. °· The inability of the mammogram to detect certain cancers. °What happens before the procedure? °· Have this test done about 1-2 weeks after your period. This is usually when your breasts are the least tender. °· If you are visiting a new doctor or clinic, send any past mammogram images to your new doctor's office. °· Wash your breasts and under your arms the day of the test. °· Do not use deodorants, perfumes, lotions, or powders on the day of the test. °· Take off any jewelry from your neck. °· Wear clothes that you can change into and out of easily. °What happens during the procedure? ° °· You will undress from the waist up. You will put on a gown. °· You will stand in front of the X-ray machine. °· Each breast will be placed between two plastic or glass plates. The plates will press down on your breast for a few seconds. Try to stay as relaxed as possible. This does not cause any harm to your breasts. Any discomfort you feel will be very brief. °· X-rays will be taken from different angles of each breast. °The  procedure may vary among doctors and hospitals. °What happens after the procedure? °· The mammogram will be read by a specialist (radiologist). °· You may need to do certain parts of the test again. This depends on the quality of the images. °· Ask when your test results will be ready. Make sure you get your test results. °· You may go back to your normal activities. °Summary °· A mammogram is a low energy X-ray of the breasts that is done to check for abnormal changes. A man may have this test if he has a lump or swelling in his breast. °· Before the procedure, tell your doctor about any breast problems that you have had in the past. °· Have this test done about 1-2 weeks after your period. °· For the test, each breast will be placed between two plastic or glass plates. The plates will press down on your breast for a few seconds. °· The mammogram will be read by a specialist (radiologist). Ask when your test results will be ready. Make sure you get your test results. °This information is not intended to replace advice given to you by your health care provider. Make sure you discuss any questions you have with your health care provider. °Document Revised: 01/10/2018 Document Reviewed: 01/10/2018 °Elsevier Patient Education © 2020 [employer] ° °

## 2019-10-09 NOTE — Progress Notes (Signed)
[name] is a 39 y.o. female who presents to [facility] clinic today with complaint of left breat pain and lump x 2 months. Pain is intermittent and rated at 6/10 at the worst. She has not taken anything for pain. She denies nipple discharge.    Pap Smear: Pap not smear completed today. Last Pap smear was 2019 at [facility] was normal. Per patient has no history of an abnormal Pap smear. Last Pap smear result is not available in Epic.   Physical exam: Breasts Breasts symmetrical. No skin abnormalities bilateral breasts. No nipple retraction bilateral breasts. No nipple discharge bilateral breasts. No lymphadenopathy. Small masses noted in left breast at 3:00 and right breast at 2:00.    Pelvic/Bimanual Pap is not indicated today    Smoking History: Patient has never smoked.    Patient Navigation: Patient education provided. Access to services provided for patient through [facility] program.    Colorectal Cancer Screening: Per patient has never had colonoscopy completed No complaints today.    Breast and Cervical Cancer Risk Assessment: Patient has family history of breast cancer (sister), no known genetic mutations, or radiation treatment to the chest before age 22. Patient does not have history of cervical dysplasia, immunocompromised, or DES exposure in-utero.  Risk Assessment    Risk Scores      10/09/2019   Last edited by: Narda Rutherford, LPN   5-year risk: 0.6 %   Lifetime risk: 12.1 %          A: [facility] exam without pap smear Complaint of left breat pain and mass  P: Referred patient to the [facility] for a diagnostic mammogram. Appointment scheduled 10/09/2019 @ 9:40 am.  Marny Lowenstein, PA-C 10/09/2019 9:29 AM

## 2020-04-13 ENCOUNTER — Ambulatory Visit
Admission: RE | Admit: 2020-04-13 | Discharge: 2020-04-13 | Disposition: A | Discharge status: Home / Self Care | Payer: No Typology Code available for payment source | Source: Ambulatory Visit | Attending: Obstetrics and Gynecology | Admitting: Obstetrics and Gynecology

## 2020-04-13 ENCOUNTER — Other Ambulatory Visit: Payer: Self-pay | Admitting: Obstetrics and Gynecology

## 2020-04-13 ENCOUNTER — Other Ambulatory Visit: Payer: Self-pay

## 2020-04-13 DIAGNOSIS — R921 Mammographic calcification found on diagnostic imaging of breast: Secondary | ICD-10-CM

## 2020-10-12 ENCOUNTER — Ambulatory Visit
Admission: RE | Admit: 2020-10-12 | Discharge: 2020-10-12 | Disposition: A | Discharge status: Home / Self Care | Payer: No Typology Code available for payment source | Source: Ambulatory Visit | Attending: Obstetrics and Gynecology | Admitting: Obstetrics and Gynecology

## 2020-10-12 ENCOUNTER — Encounter (INDEPENDENT_AMBULATORY_CARE_PROVIDER_SITE_OTHER): Payer: Self-pay

## 2020-10-12 ENCOUNTER — Other Ambulatory Visit: Payer: Self-pay | Admitting: Obstetrics and Gynecology

## 2020-10-12 ENCOUNTER — Ambulatory Visit: Payer: Self-pay | Admitting: *Deleted

## 2020-10-12 ENCOUNTER — Other Ambulatory Visit: Payer: Self-pay

## 2020-10-12 VITALS — BP 114/76 | Wt 105.4 lb

## 2020-10-12 DIAGNOSIS — N6320 Unspecified lump in the left breast, unspecified quadrant: Secondary | ICD-10-CM

## 2020-10-12 DIAGNOSIS — R921 Mammographic calcification found on diagnostic imaging of breast: Secondary | ICD-10-CM

## 2020-10-12 DIAGNOSIS — N644 Mastodynia: Secondary | ICD-10-CM

## 2020-10-12 DIAGNOSIS — Z1239 Encounter for other screening for malignant neoplasm of breast: Secondary | ICD-10-CM

## 2020-10-12 DIAGNOSIS — N6321 Unspecified lump in the left breast, upper outer quadrant: Secondary | ICD-10-CM

## 2020-10-12 NOTE — Patient Instructions (Signed)
Explained breast self awareness with Stacey Wade. Patient did not need a Pap smear today due to last Pap smear and HPV typing was 03/12/2020. Let her know [facility] will cover Pap smears and HPV typing every 5 years unless has a history of abnormal Pap smears. Referred patient to the [facility] for a diagnostic mammogram per recommendation. Appointment scheduled Tuesday, Oct 12, 2020 at 1020. Patient aware of appointment and will be there. Stacey Wade verbalized understanding.  Marx Doig, Kathaleen Maser, RN 8:21 AM

## 2020-10-12 NOTE — Progress Notes (Signed)
[name] is a 40 y.o. female who presents to [facility] clinic today with complaint of intermittent bilateral outer breast pain. Patient stated the right outer breast pain started one month ago and the left outer breast for over a year. Patient rated the left breast pain at a 6 out of 10 and right at a 1 out of 10. Patients last left breast diagnostic mammogram was completed 04/13/2020 with 6 month bilateral diagnostic mammogram recommended for follow-up.   Pap Smear: Pap smear not completed today. Last Pap smear was 03/12/2020 at the [facility] clinic and was normal with negative HPV. Per patient has no history of an abnormal Pap smear. Last Pap smear result is not available in Epic. Pap smear will be scanned into Epic.   Physical exam: Breasts Breasts symmetrical. No skin abnormalities bilateral breasts. No nipple retraction bilateral breasts. No nipple discharge bilateral breasts. No lymphadenopathy. No lumps palpated right breast. Palpated a pea sized lump within the left breast at 2 o'clock. Complaints of bilateral outer breast pain on exam.  MM DIAG BREAST TOMO UNI LEFT  Result Date: 04/13/2020 CLINICAL DATA:  Short-term follow-up for left breast calcifications, initially evaluated on 10/09/2019. EXAM: DIGITAL DIAGNOSTIC UNILATERAL LEFT MAMMOGRAM WITH TOMO AND CAD COMPARISON:  Previous exam(s). ACR Breast Density Category c: The breast tissue is heterogeneously dense, which may obscure small masses. FINDINGS: The small group of punctate calcifications in the inferior left breast are stable. There are no masses or areas of architectural distortion. There are no new calcifications. No mammographic change. Mammographic images were processed with CAD. IMPRESSION: 1. Probably benign small group of calcifications in the left breast, stable for 6 months. Additional short-term follow-up recommended. RECOMMENDATION: Diagnostic mammography with left breast magnification views  in 6 months. I have discussed the findings and recommendations with the patient. If applicable, a reminder letter will be sent to the patient regarding the next appointment. BI-RADS CATEGORY  3: Probably benign. Electronically Signed   By: Amie Portland M.D.   On: 04/13/2020 14:52   MS DIGITAL SCREENING TOMO BILATERAL  Result Date: 07/31/2017 CLINICAL DATA:  Screening. EXAM: DIGITAL SCREENING BILATERAL MAMMOGRAM WITH TOMO AND CAD COMPARISON:  None. ACR Breast Density Category b: There are scattered areas of fibroglandular density. FINDINGS: There are no findings suspicious for malignancy. Images were processed with CAD. IMPRESSION: No mammographic evidence of malignancy. A result letter of this screening mammogram will be mailed directly to the patient. RECOMMENDATION: Screening mammogram at age 40. (Code:SM-B-40A) BI-RADS CATEGORY  1: Negative. Electronically Signed   By: Gerome Sam III M.D   On: 07/31/2017 14:12   MS DIGITAL DIAG TOMO BILAT  Result Date: 10/09/2019 CLINICAL DATA:  Patient presents for a bilateral diagnostic examination due to a palpable abnormality over the upper outer left breast as well as a palpable abnormality over the inner midportion of the right breast. EXAM: DIGITAL DIAGNOSTIC bilateral MAMMOGRAM WITH CAD AND TOMO ULTRASOUND bilateral BREAST COMPARISON:  07/31/2017 ACR Breast Density Category c: The breast tissue is heterogeneously dense, which may obscure small masses. FINDINGS: Examination demonstrates no focal abnormality over the upper outer left breast nor over the inner midportion of the right breast to account for patient's palpable abnormalities. There is stable asymmetric density over the middle to anterior third of the central left breast. There are a few loosely associated punctate/round microcalcifications over the middle third of the lower central left breast spanning approximately 1.5 cm likely benign fibrocystic change. Remainder of the exam  is unremarkable.  Mammographic images were processed with CAD. Targeted ultrasound is performed, showing no focal abnormality over the upper outer quadrant of the left breast and no focal abnormality over the inner midportion of the right breast to account for patient's palpable abnormalities. IMPRESSION: 1. No focal abnormality within either breast to account for patient's palpable abnormalities. 2. Probable benign microcalcifications over the middle third of the lower central left breast. RECOMMENDATION: Recommend continued management of patient's bilateral palpable abnormalities on a clinical basis. Otherwise, recommend a six-month follow-up diagnostic left breast mammogram with magnification views to document stability of the probable benign left breast microcalcifications. I have discussed the findings and recommendations with the patient. If applicable, a reminder letter will be sent to the patient regarding the next appointment. BI-RADS CATEGORY  3: Probably benign. Electronically Signed   By: Elberta Fortis M.D.   On: 10/09/2019 11:10    Pelvic/Bimanual Pap is not indicated today per BCCCP guidelines.   Smoking History: Patient has never smoked.   Patient Navigation: Patient education provided. Access to services provided for patient through [place] program. Spanish interpreter Stacey Wade from [facility] provided.    Breast and Cervical Cancer Risk Assessment: Patient has family history of her sister having breast cancer. Patient has no known genetic mutations or history of radiation treatment to the chest before age 42. Patient does not have history of cervical dysplasia, immunocompromised, or DES exposure in-utero.  Risk Assessment    Risk Scores      10/12/2020 10/09/2019   Last edited by: Meryl Dare, CMA [place]ane, Wyatt Haste   5-year risk: 0.7 % 0.6 %   Lifetime risk: 12 % 12.1 %          A: BCCCP exam without pap smear Complaint of bilateral outer breast pain.  P: Referred patient to the  [facility] for a diagnostic mammogram per recommendation. Appointment scheduled Tuesday, Oct 12, 2020 at 1020.  Priscille Heidelberg, RN 10/12/2020 8:20 AM

## 2021-08-11 ENCOUNTER — Other Ambulatory Visit: Payer: Self-pay

## 2021-08-11 DIAGNOSIS — R921 Mammographic calcification found on diagnostic imaging of breast: Secondary | ICD-10-CM

## 2021-10-13 ENCOUNTER — Ambulatory Visit
Admission: RE | Admit: 2021-10-13 | Discharge: 2021-10-13 | Disposition: A | Discharge status: Home / Self Care | Payer: No Typology Code available for payment source | Source: Ambulatory Visit | Attending: Obstetrics and Gynecology | Admitting: Obstetrics and Gynecology

## 2021-10-13 ENCOUNTER — Encounter (INDEPENDENT_AMBULATORY_CARE_PROVIDER_SITE_OTHER): Payer: Self-pay

## 2021-10-13 ENCOUNTER — Ambulatory Visit: Payer: Self-pay | Admitting: *Deleted

## 2021-10-13 VITALS — BP 104/70 | Wt 103.7 lb

## 2021-10-13 DIAGNOSIS — N6321 Unspecified lump in the left breast, upper outer quadrant: Secondary | ICD-10-CM

## 2021-10-13 DIAGNOSIS — R921 Mammographic calcification found on diagnostic imaging of breast: Secondary | ICD-10-CM

## 2021-10-13 DIAGNOSIS — Z1239 Encounter for other screening for malignant neoplasm of breast: Secondary | ICD-10-CM

## 2021-10-13 NOTE — Patient Instructions (Addendum)
Explained breast self awareness with Stacey Wade. Patient did not need a Pap smear today due to last Pap smear and HPV typing was 03/12/2020. Let her know BCCCP will cover Pap smears and HPV typing every 5 years unless has a history of abnormal Pap smears. Referred patient to the [facility] for a diagnostic mammogram per recommendation. Appointment scheduled Thursday, Oct 13, 2021 at 1030. Patient aware of appointment and will be there. Stacey Wade verbalized understanding. ? ?Denyce Harr, Kathaleen Maser, RN ?9:54 AM ? ? ? ? ?

## 2021-10-13 NOTE — Progress Notes (Signed)
Ms. DALAYNA LAUTER Jayme Cloud is a 41 y.o. female who presents to [facility] clinic today with complaint of left breast lump that palpated on exam 10/12/2020 and left upper breast pain x one month. Patient states the pain comes and goes. Patient rates the pain at a 5 out of 10. Patients last diagnostic mammogram was completed 10/12/2020 that a one year diagnostic mammogram is recommended for follow up. ?  ?Pap Smear: Pap smear not completed today. Last Pap smear was 03/12/2020 at the [facility] clinic and was normal with negative HPV. Per patient has no history of an abnormal Pap smear. Last Pap smear result is not available in Epic. Pap smear will be scanned into Epic. ?  ?Physical exam: ?Breasts ?Breasts symmetrical. No skin abnormalities bilateral breasts. No nipple retraction bilateral breasts. No nipple discharge bilateral breasts. No lymphadenopathy. No lumps palpated right breast. Palpated a pea sized lump within the left breast at 2 o'clock 3-4 cm from the nipple consistent with previous exam 10/12/2020. No complaints of pain or tenderness on exam. ? ?MM DIAG BREAST TOMO UNI LEFT ? ?Result Date: 04/13/2020 ?CLINICAL DATA:  Short-term follow-up for left breast calcifications, initially evaluated on 10/09/2019. EXAM: DIGITAL DIAGNOSTIC UNILATERAL LEFT MAMMOGRAM WITH TOMO AND CAD COMPARISON:  Previous exam(s). ACR Breast Density Category c: The breast tissue is heterogeneously dense, which may obscure small masses. FINDINGS: The small group of punctate calcifications in the inferior left breast are stable. There are no masses or areas of architectural distortion. There are no new calcifications. No mammographic change. Mammographic images were processed with CAD. IMPRESSION: 1. Probably benign small group of calcifications in the left breast, stable for 6 months. Additional short-term follow-up recommended. RECOMMENDATION: Diagnostic mammography with left breast magnification views in 6 months. I have  discussed the findings and recommendations with the patient. If applicable, a reminder letter will be sent to the patient regarding the next appointment. BI-RADS CATEGORY  3: Probably benign. Electronically Signed   By: Amie Portland M.D.   On: 04/13/2020 14:52  ? ?MS DIGITAL SCREENING TOMO BILATERAL ? ?Result Date: 07/31/2017 ?CLINICAL DATA:  Screening. EXAM: DIGITAL SCREENING BILATERAL MAMMOGRAM WITH TOMO AND CAD COMPARISON:  None. ACR Breast Density Category b: There are scattered areas of fibroglandular density. FINDINGS: There are no findings suspicious for malignancy. Images were processed with CAD. IMPRESSION: No mammographic evidence of malignancy. A result letter of this screening mammogram will be mailed directly to the patient. RECOMMENDATION: Screening mammogram at age 75. (Code:SM-B-40A) BI-RADS CATEGORY  1: Negative. Electronically Signed   By: Gerome Sam III M.D   On: 07/31/2017 14:12  ? ?MS DIGITAL DIAG TOMO BILAT ? ?Result Date: 10/12/2020 ?CLINICAL DATA:  One year follow-up left breast calcifications. Palpable lump on the left. EXAM: DIGITAL DIAGNOSTIC BILATERAL MAMMOGRAM WITH TOMOSYNTHESIS AND CAD; ULTRASOUND LEFT BREAST LIMITED TECHNIQUE: Bilateral digital diagnostic mammography and breast tomosynthesis was performed. The images were evaluated with computer-aided detection.; Targeted ultrasound examination of the left breast was performed COMPARISON:  Previous exam(s). ACR Breast Density Category c: The breast tissue is heterogeneously dense, which may obscure small masses. FINDINGS: The calcifications in the central inferior left breast are stable. No other suspicious mammographic findings are identified bilaterally. On physical exam, no suspicious lumps are identified. Targeted ultrasound is performed, showing fibrocystic changes in the region of the palpable lump on the left. IMPRESSION: There are several cysts in the region of the palpable lump on the left, explaining the patient's  symptoms. There are stable  probably benign left breast calcifications located centrally and inferiorly. No other evidence of malignancy. RECOMMENDATION: Recommend 12 month follow-up mammography of the probably benign left breast calcifications. The patient will be due for bilateral mammography at that time. I have discussed the findings and recommendations with the patient. If applicable, a reminder letter will be sent to the patient regarding the next appointment. BI-RADS CATEGORY  3: Probably benign. Electronically Signed   By: Gerome Sam III M.D   On: 10/12/2020 12:34  ? ?MS DIGITAL DIAG TOMO BILAT ? ?Result Date: 10/09/2019 ?CLINICAL DATA:  Patient presents for a bilateral diagnostic examination due to a palpable abnormality over the upper outer left breast as well as a palpable abnormality over the inner midportion of the right breast. EXAM: DIGITAL DIAGNOSTIC bilateral MAMMOGRAM WITH CAD AND TOMO ULTRASOUND bilateral BREAST COMPARISON:  07/31/2017 ACR Breast Density Category c: The breast tissue is heterogeneously dense, which may obscure small masses. FINDINGS: Examination demonstrates no focal abnormality over the upper outer left breast nor over the inner midportion of the right breast to account for patient's palpable abnormalities. There is stable asymmetric density over the middle to anterior third of the central left breast. There are a few loosely associated punctate/round microcalcifications over the middle third of the lower central left breast spanning approximately 1.5 cm likely benign fibrocystic change. Remainder of the exam is unremarkable. Mammographic images were processed with CAD. Targeted ultrasound is performed, showing no focal abnormality over the upper outer quadrant of the left breast and no focal abnormality over the inner midportion of the right breast to account for patient's palpable abnormalities. IMPRESSION: 1. No focal abnormality within either breast to account for patient's  palpable abnormalities. 2. Probable benign microcalcifications over the middle third of the lower central left breast. RECOMMENDATION: Recommend continued management of patient's bilateral palpable abnormalities on a clinical basis. Otherwise, recommend a six-month follow-up diagnostic left breast mammogram with magnification views to document stability of the probable benign left breast microcalcifications. I have discussed the findings and recommendations with the patient. If applicable, a reminder letter will be sent to the patient regarding the next appointment. BI-RADS CATEGORY  3: Probably benign. Electronically Signed   By: Elberta Fortis M.D.   On: 10/09/2019 11:10   ? ?Pelvic/Bimanual ?Pap is not indicated today per BCCCP guidelines. ?  ?Smoking History: ?Patient has never smoked. ?  ?Patient Navigation: ?Patient education provided. Access to services provided for patient through [place] program. Spanish interpreter Josefina Do from [facility] provided.  ?  ?Breast and Cervical Cancer Risk Assessment: ?Patient has family history of her sister having breast cancer. Patient has no known genetic mutations or history of radiation treatment to the chest before age 63. Patient does not have history of cervical dysplasia, immunocompromised, or DES exposure in-utero. ? ?Risk Assessment   ? ? Risk Scores   ? ?   10/13/2021 10/12/2020  ? Last edited by: Narda Rutherford, LPN Meryl Dare, CMA  ? 5-year risk: 0.8 % 0.7 %  ? Lifetime risk: 11.9 % 12 %  ? ?  ?  ? ?  ? ? ?A: ?BCCCP exam without pap smear ?Complaint of left breast lump and pain. ? ?P: ?Referred patient to the [facility] for a diagnostic mammogram per recommendation. Appointment scheduled Thursday, Oct 13, 2021 at 1030. ? ?Priscille Heidelberg, RN ?10/13/2021 9:54 AM   ?

## 2022-11-30 IMAGING — MG DIGITAL DIAGNOSTIC BILAT W/ TOMO W/ CAD
6 of 10 series · 6 of 26 positions shown · non-contrast
Comparison: Previous exam(s).

CLINICAL DATA: Follow-up left breast probably benign
calcifications.

EXAM:
DIGITAL DIAGNOSTIC BILATERAL MAMMOGRAM WITH TOMOSYNTHESIS AND CAD
TECHNIQUE: Bilateral digital diagnostic mammography and breast tomosynthesis
was performed. The images were evaluated with computer-aided
detection.

[L CC]
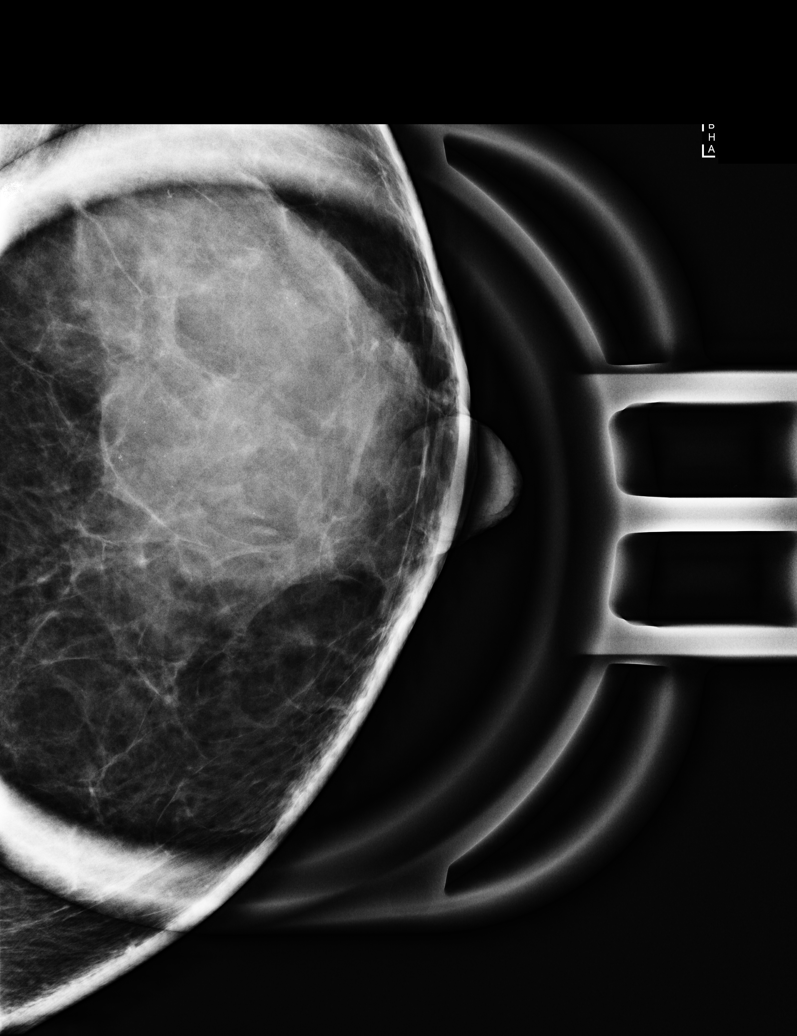

[L ML]
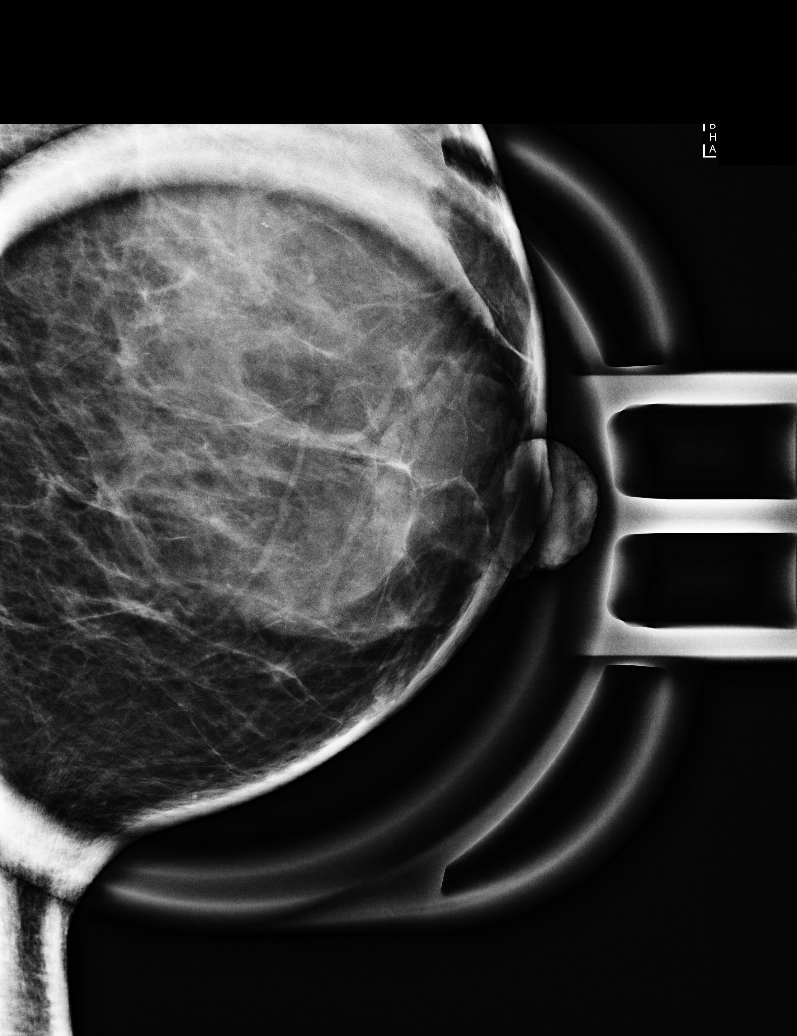

[R CC synth-2D]
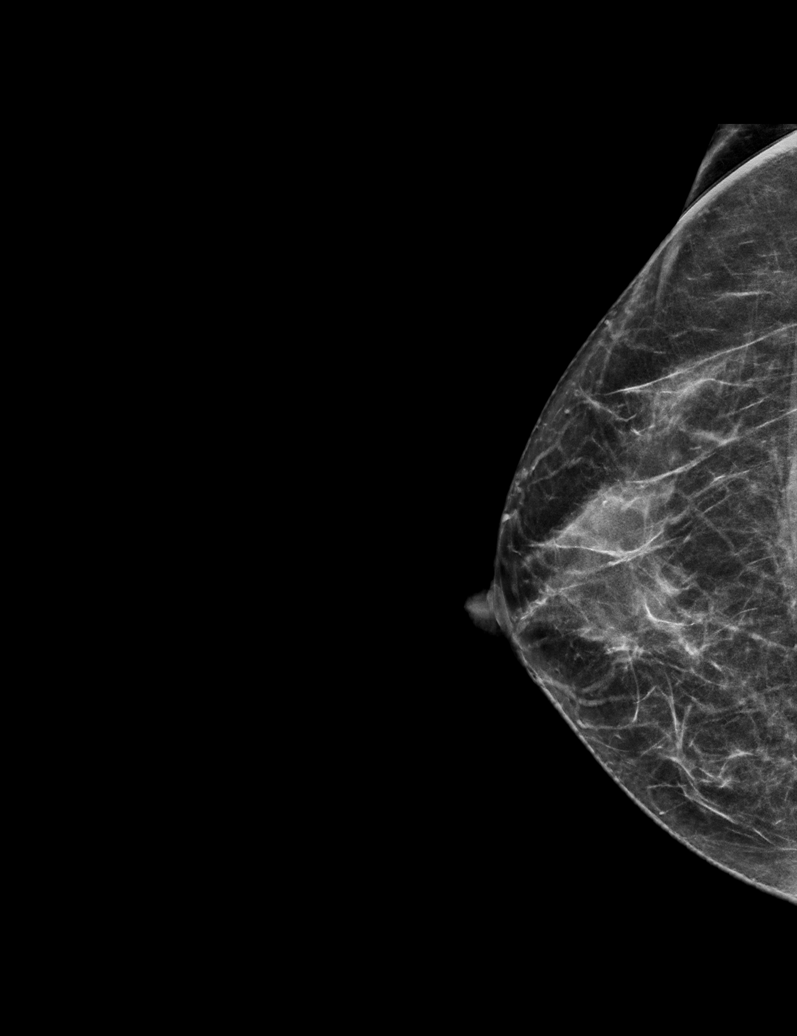

[L CC synth-2D]
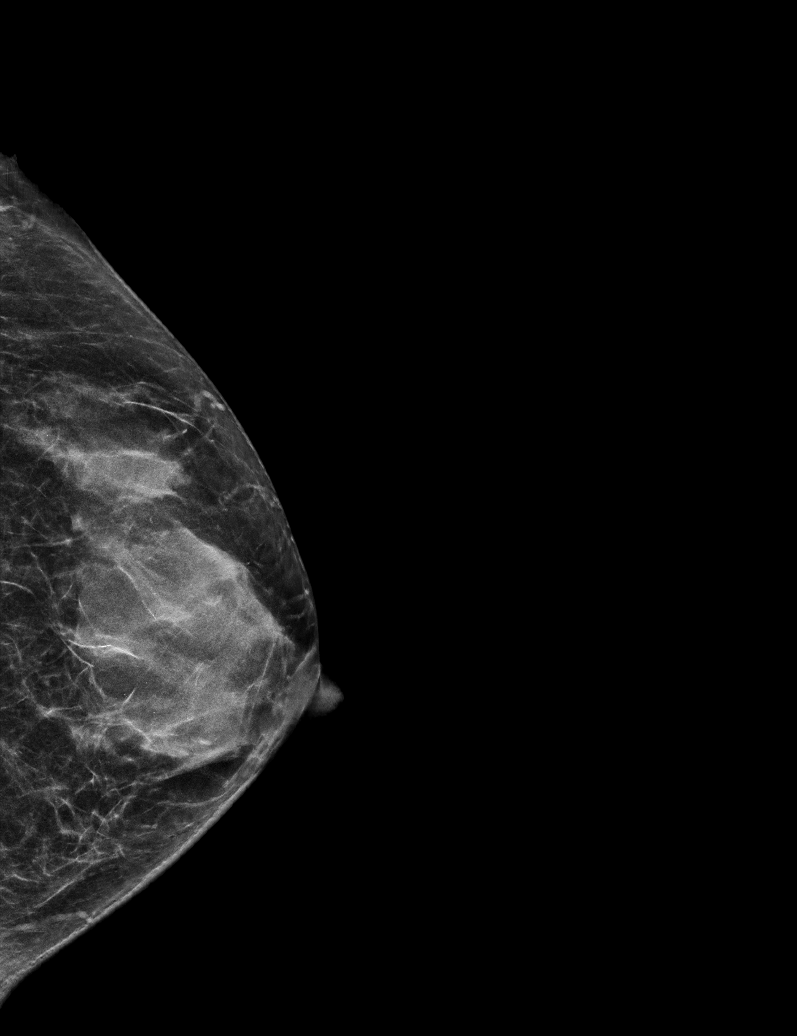

[R MLO synth-2D]
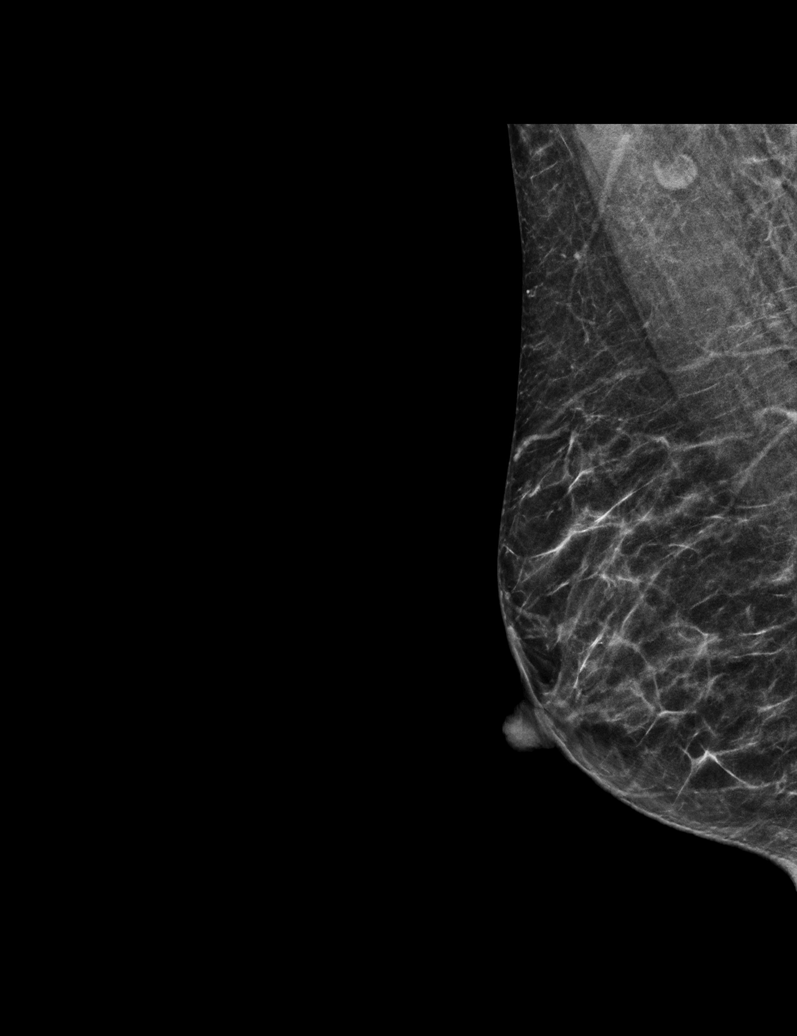

[L MLO synth-2D]
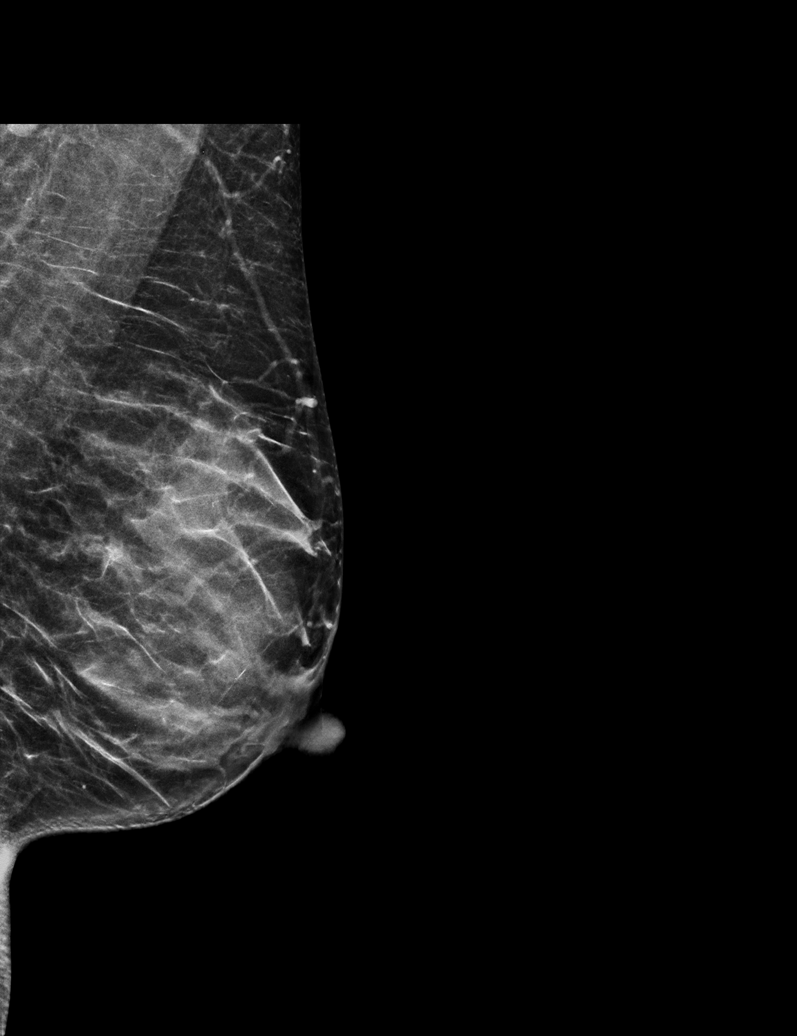

[6 of 26 positions shown; findings below may reference images not displayed]

ACR Breast Density Category c: The breast tissue is heterogeneously
dense, which may obscure small masses.
FINDINGS: The previously described probably benign left breast calcifications
have not changed significantly since 10/09/2019. No interval
findings suspicious for malignancy in either breast.
IMPRESSION: 1. The previously described probably benign left breast
calcifications have been stable for 2 years, compatible with benign
calcifications. These do not need further follow-up.
2. No evidence of malignancy either breast.

RECOMMENDATION:
Bilateral screening mammogram in 1 year.

I have discussed the findings and recommendations with the patient.
If applicable, a reminder letter will be sent to the patient
regarding the next appointment.

BI-RADS CATEGORY  2: Benign.

## 2022-12-05 ENCOUNTER — Other Ambulatory Visit: Payer: Self-pay | Admitting: Obstetrics and Gynecology

## 2022-12-05 DIAGNOSIS — Z1231 Encounter for screening mammogram for malignant neoplasm of breast: Secondary | ICD-10-CM

## 2022-12-13 ENCOUNTER — Telehealth: Payer: Self-pay

## 2022-12-13 NOTE — Telephone Encounter (Signed)
Telephoned patient at mobile number using interpreter [phone]. Patient left a voice message that she had questions about her appointment with BCCCP. Left a voice message with BCCCP contact information.

## 2022-12-14 ENCOUNTER — Ambulatory Visit: Payer: Self-pay | Admitting: *Deleted

## 2022-12-14 ENCOUNTER — Ambulatory Visit
Admission: RE | Admit: 2022-12-14 | Discharge: 2022-12-14 | Disposition: A | Discharge status: Home / Self Care | Payer: Self-pay | Source: Ambulatory Visit | Attending: Obstetrics and Gynecology | Admitting: Obstetrics and Gynecology

## 2022-12-14 VITALS — BP 108/74 | Wt 104.9 lb

## 2022-12-14 DIAGNOSIS — Z1239 Encounter for other screening for malignant neoplasm of breast: Secondary | ICD-10-CM

## 2022-12-14 DIAGNOSIS — Z1231 Encounter for screening mammogram for malignant neoplasm of breast: Secondary | ICD-10-CM

## 2022-12-14 NOTE — Progress Notes (Addendum)
[name] Stacey Wade is a 42 y.o. female who presents to [facility] clinic today with complaint of left breast pain and tenderness in axillary area rated 6 on pain scale of 0-10 that comes and goes no specific times or in relations to any activities. She also complains of back pain on left side. States she massages the area to help relieve pain. She is not taking any over the counter medication for pain. .    Pap Smear: Pap not smear completed today. Last Pap smear was 03/12/2020 at [facility] and was normal with negative HPV. Per patient has no history of an abnormal Pap smear. Last Pap smear result is not available in Epic. Will request records from [facility].    Physical exam: Breasts Breasts symmetrical. No skin abnormalities bilateral breasts. No nipple retraction bilateral breasts. No nipple discharge bilateral breasts. No lymphadenopathy. Left breast with pea size lump 1-2 cm at 2 o'clock from nipple consistent to previous exam. Mammogram on 10/13/2021 a benign calcification was noted in same area.   ..MS DIGITAL DIAG TOMO BILAT  Result Date: 10/13/2021 CLINICAL DATA:  Follow-up left breast probably benign calcifications. EXAM: DIGITAL DIAGNOSTIC BILATERAL MAMMOGRAM WITH TOMOSYNTHESIS AND CAD TECHNIQUE: Bilateral digital diagnostic mammography and breast tomosynthesis was performed. The images were evaluated with computer-aided detection. COMPARISON:  Previous exam(s). ACR Breast Density Category c: The breast tissue is heterogeneously dense, which may obscure small masses. FINDINGS: The previously described probably benign left breast calcifications have not changed significantly since 10/09/2019. No interval findings suspicious for malignancy in either breast. IMPRESSION: 1. The previously described probably benign left breast calcifications have been stable for 2 years, compatible with benign calcifications. These do not need further follow-up. 2. No evidence of malignancy  either breast. RECOMMENDATION: Bilateral screening mammogram in 1 year. I have discussed the findings and recommendations with the patient. If applicable, a reminder letter will be sent to the patient regarding the next appointment. BI-RADS CATEGORY  2: Benign. Electronically Signed   By: Beckie Salts M.D.   On: 10/13/2021 11:29  MS DIGITAL DIAG TOMO BILAT  Result Date: 10/12/2020 CLINICAL DATA:  One year follow-up left breast calcifications. Palpable lump on the left. EXAM: DIGITAL DIAGNOSTIC BILATERAL MAMMOGRAM WITH TOMOSYNTHESIS AND CAD; ULTRASOUND LEFT BREAST LIMITED TECHNIQUE: Bilateral digital diagnostic mammography and breast tomosynthesis was performed. The images were evaluated with computer-aided detection.; Targeted ultrasound examination of the left breast was performed COMPARISON:  Previous exam(s). ACR Breast Density Category c: The breast tissue is heterogeneously dense, which may obscure small masses. FINDINGS: The calcifications in the central inferior left breast are stable. No other suspicious mammographic findings are identified bilaterally. On physical exam, no suspicious lumps are identified. Targeted ultrasound is performed, showing fibrocystic changes in the region of the palpable lump on the left. IMPRESSION: There are several cysts in the region of the palpable lump on the left, explaining the patient's symptoms. There are stable probably benign left breast calcifications located centrally and inferiorly. No other evidence of malignancy. RECOMMENDATION: Recommend 12 month follow-up mammography of the probably benign left breast calcifications. The patient will be due for bilateral mammography at that time. I have discussed the findings and recommendations with the patient. If applicable, a reminder letter will be sent to the patient regarding the next appointment. BI-RADS CATEGORY  3: Probably benign. Electronically Signed   By: Gerome Sam III M.D   On: 10/12/2020 12:34   MM DIAG  BREAST TOMO UNI LEFT  Result Date:  04/13/2020 CLINICAL DATA:  Short-term follow-up for left breast calcifications, initially evaluated on 10/09/2019. EXAM: DIGITAL DIAGNOSTIC UNILATERAL LEFT MAMMOGRAM WITH TOMO AND CAD COMPARISON:  Previous exam(s). ACR Breast Density Category c: The breast tissue is heterogeneously dense, which may obscure small masses. FINDINGS: The small group of punctate calcifications in the inferior left breast are stable. There are no masses or areas of architectural distortion. There are no new calcifications. No mammographic change. Mammographic images were processed with CAD. IMPRESSION: 1. Probably benign small group of calcifications in the left breast, stable for 6 months. Additional short-term follow-up recommended. RECOMMENDATION: Diagnostic mammography with left breast magnification views in 6 months. I have discussed the findings and recommendations with the patient. If applicable, a reminder letter will be sent to the patient regarding the next appointment. BI-RADS CATEGORY  3: Probably benign. Electronically Signed   By: Amie Portland M.D.   On: 04/13/2020 14:52   MS DIGITAL DIAG TOMO BILAT  Result Date: 10/09/2019 CLINICAL DATA:  Patient presents for a bilateral diagnostic examination due to a palpable abnormality over the upper outer left breast as well as a palpable abnormality over the inner midportion of the right breast. EXAM: DIGITAL DIAGNOSTIC bilateral MAMMOGRAM WITH CAD AND TOMO ULTRASOUND bilateral BREAST COMPARISON:  07/31/2017 ACR Breast Density Category c: The breast tissue is heterogeneously dense, which may obscure small masses. FINDINGS: Examination demonstrates no focal abnormality over the upper outer left breast nor over the inner midportion of the right breast to account for patient's palpable abnormalities. There is stable asymmetric density over the middle to anterior third of the central left breast. There are a few loosely associated punctate/round  microcalcifications over the middle third of the lower central left breast spanning approximately 1.5 cm likely benign fibrocystic change. Remainder of the exam is unremarkable. Mammographic images were processed with CAD. Targeted ultrasound is performed, showing no focal abnormality over the upper outer quadrant of the left breast and no focal abnormality over the inner midportion of the right breast to account for patient's palpable abnormalities. IMPRESSION: 1. No focal abnormality within either breast to account for patient's palpable abnormalities. 2. Probable benign microcalcifications over the middle third of the lower central left breast. RECOMMENDATION: Recommend continued management of patient's bilateral palpable abnormalities on a clinical basis. Otherwise, recommend a six-month follow-up diagnostic left breast mammogram with magnification views to document stability of the probable benign left breast microcalcifications. I have discussed the findings and recommendations with the patient. If applicable, a reminder letter will be sent to the patient regarding the next appointment. BI-RADS CATEGORY  3: Probably benign. Electronically Signed   By: Elberta Fortis M.D.   On: 10/09/2019 11:10       Pelvic/Bimanual Pap is not indicated today    Smoking History: Patient has never smoked. Not referred to quit line.    Patient Navigation: Patient education provided. Access to services provided for patient through [facility]CP program. Natale Lay interpreter provided. No transportation provided   Colorectal Cancer Screening: Per patient has never had colonoscopy completed. Not indicated due to age. No complaints today.    Breast and Cervical Cancer Risk Assessment: Patient has sister with history of breast cancer diagnosed approximate age of 48 years of age, Sister is living. Patient has no known genetic mutations, or radiation treatment to the chest before age 15. Patient does not have history of  cervical dysplasia, immunocompromised, or DES exposure in-utero.  Risk Scores as of Encounter on 12/14/2022     Dondra Spry  5-year 1.25%   Lifetime 16.43%   This patient is [place] but has no documented birth country, so the [place] model used data from [place] patients to calculate their risk score. Document a birth country in the Demographics activity for a more accurate score.         Last calculated by Meryl Dare, CMA on 12/14/2022 at  9:58 AM        A: [facility]CP exam without pap smear Complaint of left breast lump and pain.   P: Referred patient to the [facility] for a screening mammogram. Appointment scheduled 12/14/2022 at 11:10 am at [facility].  Joette Catching, RN 12/14/2022 10:24 AM    Attestation of Supervision of Student:  I confirm that I have verified the information documented in the nurse practitioner student's note and that I have also personally reperformed the history, physical exam and all medical decision making activities.  I have verified that all services and findings are accurately documented in this student's note; and I agree with management and plan as outlined in the documentation. I have also made any necessary editorial changes.  Complaints of left upper outer breast tenderness on exam and left breast lump consistent with previous exam 10/13/2021.  [place], Kathaleen Maser, RN [facility] [employer], [facility] 12/14/2022 10:49 AM

## 2022-12-14 NOTE — Patient Instructions (Signed)
Explained breast self awareness with Stacey Wade. Patient did not need a Pap smear today due to last Pap smear and HPV typing  was 03/12/2020. Let her know [facility]CP will cover Pap smears and HPV typing every 5 years unless has a history of abnormal Pap smears. Referred patient to the [facility] for a screening mammogram. Appointment scheduled 12/14/2022 at 11:10 am at [facility]. Patient aware of appointment and will be there. Let patient know the Breast Center will follow up with her within the next couple weeks with results of her mammogram by letter or phone. Stacey Wade verbalized understanding.  Emmerson Taddei, Kathaleen Maser, RN 10:43 AM
